# Patient Record
Sex: Female | Born: 1960
Health system: Southern US, Community
[De-identification: ages and names within clinical notes are randomized; demographics above are authoritative.]

## PROBLEM LIST (undated history)

## (undated) DIAGNOSIS — T8859XA Other complications of anesthesia, initial encounter: Secondary | ICD-10-CM

## (undated) DIAGNOSIS — Z9889 Other specified postprocedural states: Secondary | ICD-10-CM

## (undated) DIAGNOSIS — E785 Hyperlipidemia, unspecified: Secondary | ICD-10-CM

## (undated) DIAGNOSIS — I10 Essential (primary) hypertension: Secondary | ICD-10-CM

## (undated) DIAGNOSIS — R112 Nausea with vomiting, unspecified: Secondary | ICD-10-CM

## (undated) DIAGNOSIS — T4145XA Adverse effect of unspecified anesthetic, initial encounter: Secondary | ICD-10-CM

## (undated) DIAGNOSIS — Z789 Other specified health status: Secondary | ICD-10-CM

## (undated) DIAGNOSIS — H409 Unspecified glaucoma: Secondary | ICD-10-CM

## (undated) HISTORY — PX: WISDOM TOOTH EXTRACTION: SHX21

## (undated) HISTORY — PX: TONSILLECTOMY: SUR1361

## (undated) HISTORY — DX: Hyperlipidemia, unspecified: E78.5

## (undated) HISTORY — DX: Unspecified glaucoma: H40.9

## (undated) HISTORY — PX: DILATION AND CURETTAGE OF UTERUS: SHX78

---

## 1998-02-05 ENCOUNTER — Ambulatory Visit (HOSPITAL_COMMUNITY): Admission: RE | Admit: 1998-02-05 | Discharge: 1998-02-05 | Payer: Self-pay | Admitting: Obstetrics & Gynecology

## 1998-04-29 ENCOUNTER — Other Ambulatory Visit: Admission: RE | Admit: 1998-04-29 | Discharge: 1998-04-29 | Payer: Self-pay | Admitting: Obstetrics & Gynecology

## 1999-06-09 ENCOUNTER — Other Ambulatory Visit: Admission: RE | Admit: 1999-06-09 | Discharge: 1999-06-09 | Payer: Self-pay | Admitting: Obstetrics & Gynecology

## 2001-03-10 ENCOUNTER — Inpatient Hospital Stay (HOSPITAL_COMMUNITY): Admission: AD | Admit: 2001-03-10 | Discharge: 2001-03-12 | Payer: Self-pay | Admitting: Obstetrics & Gynecology

## 2002-06-28 ENCOUNTER — Other Ambulatory Visit: Admission: RE | Admit: 2002-06-28 | Discharge: 2002-06-28 | Payer: Self-pay | Admitting: Surgery

## 2002-07-05 ENCOUNTER — Other Ambulatory Visit: Admission: RE | Admit: 2002-07-05 | Discharge: 2002-07-05 | Payer: Self-pay | Admitting: Internal Medicine

## 2002-08-18 ENCOUNTER — Encounter: Payer: Self-pay | Admitting: Internal Medicine

## 2002-08-18 ENCOUNTER — Ambulatory Visit (HOSPITAL_COMMUNITY): Admission: RE | Admit: 2002-08-18 | Discharge: 2002-08-18 | Payer: Self-pay | Admitting: Internal Medicine

## 2004-03-10 ENCOUNTER — Other Ambulatory Visit: Admission: RE | Admit: 2004-03-10 | Discharge: 2004-03-10 | Payer: Self-pay | Admitting: Internal Medicine

## 2005-06-28 ENCOUNTER — Encounter: Admission: RE | Admit: 2005-06-28 | Discharge: 2005-06-28 | Payer: Self-pay | Admitting: Internal Medicine

## 2008-12-08 ENCOUNTER — Emergency Department (HOSPITAL_BASED_OUTPATIENT_CLINIC_OR_DEPARTMENT_OTHER): Admission: EM | Admit: 2008-12-08 | Discharge: 2008-12-08 | Payer: Self-pay | Admitting: Emergency Medicine

## 2009-07-13 ENCOUNTER — Encounter: Admission: RE | Admit: 2009-07-13 | Discharge: 2009-07-13 | Payer: Self-pay | Admitting: Family Medicine

## 2010-01-24 ENCOUNTER — Encounter: Admission: RE | Admit: 2010-01-24 | Discharge: 2010-01-24 | Payer: Self-pay | Admitting: Obstetrics & Gynecology

## 2010-10-19 ENCOUNTER — Encounter: Payer: Self-pay | Admitting: Obstetrics & Gynecology

## 2013-08-01 ENCOUNTER — Other Ambulatory Visit: Payer: Self-pay | Admitting: Obstetrics & Gynecology

## 2013-08-01 DIAGNOSIS — N644 Mastodynia: Secondary | ICD-10-CM

## 2013-08-01 DIAGNOSIS — N643 Galactorrhea not associated with childbirth: Secondary | ICD-10-CM

## 2013-08-18 ENCOUNTER — Ambulatory Visit
Admission: RE | Admit: 2013-08-18 | Discharge: 2013-08-18 | Disposition: A | Discharge status: Home / Self Care | Payer: BC Managed Care – PPO | Source: Ambulatory Visit | Attending: Obstetrics & Gynecology | Admitting: Obstetrics & Gynecology

## 2013-08-18 DIAGNOSIS — N643 Galactorrhea not associated with childbirth: Secondary | ICD-10-CM

## 2013-08-18 DIAGNOSIS — N644 Mastodynia: Secondary | ICD-10-CM

## 2014-11-14 ENCOUNTER — Other Ambulatory Visit: Payer: Self-pay | Admitting: Gastroenterology

## 2014-11-22 ENCOUNTER — Encounter (HOSPITAL_COMMUNITY): Payer: Self-pay | Admitting: *Deleted

## 2014-11-26 ENCOUNTER — Other Ambulatory Visit: Payer: Self-pay | Admitting: Gastroenterology

## 2014-12-04 ENCOUNTER — Ambulatory Visit (HOSPITAL_COMMUNITY)
Admission: RE | Admit: 2014-12-04 | Discharge: 2014-12-04 | Disposition: A | Discharge status: Home / Self Care | Payer: BLUE CROSS/BLUE SHIELD | Source: Ambulatory Visit | Attending: Gastroenterology | Admitting: Gastroenterology

## 2014-12-04 ENCOUNTER — Ambulatory Visit (HOSPITAL_COMMUNITY): Payer: BLUE CROSS/BLUE SHIELD | Admitting: Anesthesiology

## 2014-12-04 ENCOUNTER — Encounter (HOSPITAL_COMMUNITY)
Admission: RE | Disposition: A | Discharge status: Home / Self Care | Payer: Self-pay | Source: Ambulatory Visit | Attending: Gastroenterology

## 2014-12-04 ENCOUNTER — Encounter (HOSPITAL_COMMUNITY): Payer: Self-pay | Admitting: *Deleted

## 2014-12-04 DIAGNOSIS — Z9089 Acquired absence of other organs: Secondary | ICD-10-CM | POA: Insufficient documentation

## 2014-12-04 DIAGNOSIS — Z1211 Encounter for screening for malignant neoplasm of colon: Secondary | ICD-10-CM | POA: Diagnosis not present

## 2014-12-04 HISTORY — DX: Other complications of anesthesia, initial encounter: T88.59XA

## 2014-12-04 HISTORY — DX: Adverse effect of unspecified anesthetic, initial encounter: T41.45XA

## 2014-12-04 HISTORY — PX: COLONOSCOPY WITH PROPOFOL: SHX5780

## 2014-12-04 HISTORY — DX: Other specified postprocedural states: Z98.890

## 2014-12-04 HISTORY — DX: Nausea with vomiting, unspecified: R11.2

## 2014-12-04 HISTORY — DX: Other specified health status: Z78.9

## 2014-12-04 SURGERY — COLONOSCOPY WITH PROPOFOL
Anesthesia: Monitor Anesthesia Care

## 2014-12-04 MED ORDER — LACTATED RINGERS IV SOLN
INTRAVENOUS | Status: DC
  Administered 2014-12-04: 1000 mL via INTRAVENOUS

## 2014-12-04 MED ORDER — PROPOFOL 10 MG/ML IV BOLUS
INTRAVENOUS | Status: AC
  Filled 2014-12-04: qty 20

## 2014-12-04 MED ORDER — PROPOFOL INFUSION 10 MG/ML OPTIME
INTRAVENOUS | Status: DC | PRN
  Administered 2014-12-04: 100 ug/kg/min via INTRAVENOUS

## 2014-12-04 MED ORDER — LIDOCAINE HCL (CARDIAC) 20 MG/ML IV SOLN
INTRAVENOUS | Status: DC | PRN
  Administered 2014-12-04: 50 mg via INTRAVENOUS

## 2014-12-04 MED ORDER — ONDANSETRON HCL 4 MG/2ML IJ SOLN
INTRAMUSCULAR | Status: DC | PRN
  Administered 2014-12-04: 4 mg via INTRAVENOUS

## 2014-12-04 MED ORDER — ONDANSETRON HCL 4 MG/2ML IJ SOLN
INTRAMUSCULAR | Status: AC
  Filled 2014-12-04: qty 2

## 2014-12-04 MED ORDER — LIDOCAINE HCL (CARDIAC) 20 MG/ML IV SOLN
INTRAVENOUS | Status: AC
  Filled 2014-12-04: qty 5

## 2014-12-04 MED ORDER — SODIUM CHLORIDE 0.9 % IV SOLN
INTRAVENOUS | Status: DC
Start: 2014-12-04 — End: 2014-12-04

## 2014-12-04 MED ORDER — PROPOFOL 10 MG/ML IV BOLUS
INTRAVENOUS | Status: DC | PRN
  Administered 2014-12-04 (×2): 50 mg via INTRAVENOUS

## 2014-12-04 MED ORDER — SODIUM CHLORIDE 0.9 % IV SOLN: INTRAVENOUS | Status: DC

## 2014-12-04 SURGICAL SUPPLY — 21 items

## 2014-12-04 NOTE — Anesthesia Preprocedure Evaluation (Signed)
Anesthesia Evaluation  Patient identified by MRN, date of birth, ID band Patient awake    Reviewed: Allergy & Precautions, NPO status , Patient's Chart, lab work & pertinent test results  History of Anesthesia Complications (+) PONV  Airway Mallampati: II  TM Distance: >3 FB Neck ROM: Full    Dental no notable dental hx.    Pulmonary neg pulmonary ROS,  breath sounds clear to auscultation  Pulmonary exam normal       Cardiovascular negative cardio ROS  Rhythm:Regular Rate:Normal     Neuro/Psych negative neurological ROS  negative psych ROS   GI/Hepatic negative GI ROS, Neg liver ROS,   Endo/Other  negative endocrine ROS  Renal/GU negative Renal ROS  negative genitourinary   Musculoskeletal negative musculoskeletal ROS (+)   Abdominal   Peds negative pediatric ROS (+)  Hematology negative hematology ROS (+)   Anesthesia Other Findings   Reproductive/Obstetrics negative OB ROS                             Anesthesia Physical Anesthesia Plan  ASA: I  Anesthesia Plan: MAC   Post-op Pain Management:    Induction:   Airway Management Planned: Simple Face Mask  Additional Equipment:   Intra-op Plan:   Post-operative Plan:   Informed Consent: I have reviewed the patients History and Physical, chart, labs and discussed the procedure including the risks, benefits and alternatives for the proposed anesthesia with the patient or authorized representative who has indicated his/her understanding and acceptance.   Dental advisory given  Plan Discussed with: CRNA  Anesthesia Plan Comments:         Anesthesia Quick Evaluation

## 2014-12-04 NOTE — Op Note (Signed)
Procedure: Baseline screening colonoscopy  Endoscopist: Earle Gell  Premedication: Propofol administered by anesthesia  Procedure: The patient was placed in the left lateral decubitus position. Anal inspection and digital rectal exam were normal. The Pentax pediatric colonoscope was introduced into the rectum and advanced to the cecum. A normal-appearing appendiceal orifice was identified. A normal-appearing ileocecal valve was identified. Colonic preparation for the exam today was good. Withdrawal time was 9 minutes  Rectum. Normal. I was unable to retroflex the colonoscope in the rectum.  Sigmoid colon and descending colon. Normal  Splenic flexure. Normal  Transverse colon. Normal  Hepatic flexure. Normal  Ascending colon. Normal  Cecum and ileocecal valve. Normal.  Assessment: Normal screening colonoscopy  Recommendation: Schedule repeat screening colonoscopy in 10 years

## 2014-12-04 NOTE — Anesthesia Postprocedure Evaluation (Signed)
  Anesthesia Post-op Note  Patient: Leslie Frederick  Procedure(s) Performed: Procedure(s) (LRB): COLONOSCOPY WITH PROPOFOL (N/A)  Patient Location: PACU  Anesthesia Type: MAC  Level of Consciousness: awake and alert   Airway and Oxygen Therapy: Patient Spontanous Breathing  Post-op Pain: mild  Post-op Assessment: Post-op Vital signs reviewed, Patient's Cardiovascular Status Stable, Respiratory Function Stable, Patent Airway and No signs of Nausea or vomiting  Last Vitals:  Filed Vitals:   12/04/14 1237  BP: 151/76  Pulse: 60  Temp:   Resp: 14    Post-op Vital Signs: stable   Complications: No apparent anesthesia complications

## 2014-12-04 NOTE — Transfer of Care (Signed)
Immediate Anesthesia Transfer of Care Note  Patient: Leslie Frederick  Procedure(s) Performed: Procedure(s): COLONOSCOPY WITH PROPOFOL (N/A)  Patient Location: PACU  Anesthesia Type:MAC  Level of Consciousness: awake, alert  and oriented  Airway & Oxygen Therapy: Patient Spontanous Breathing and Patient connected to face mask oxygen  Post-op Assessment: Report given to RN and Post -op Vital signs reviewed and stable  Post vital signs: Reviewed and stable  Last Vitals:  Filed Vitals:   12/04/14 1056  BP: 149/75  Temp: 36.3 C  Resp: 16    Complications: No apparent anesthesia complications

## 2014-12-04 NOTE — H&P (Signed)
  Procedure: Baseline screening colonoscopy  History: The patient is a 54 year old female born 05-13-1961. She is scheduled to undergo her first screening colonoscopy polypectomy to prevent colon cancer.  Medication allergies: Stadol causes nausea  Past medical history: Seasonal allergies. D&C. Tonsillectomy.  Exam: The patient is alert and lying comfortably on the endoscopy stretcher. Abdomen is soft and nontender to palpation. Lungs are clear to auscultation. Cardiac exam reveals a regular rhythm.  Plan: Proceed with baseline screening colonoscopy

## 2014-12-05 ENCOUNTER — Encounter (HOSPITAL_COMMUNITY): Payer: Self-pay | Admitting: Gastroenterology

## 2015-07-15 ENCOUNTER — Other Ambulatory Visit: Payer: Self-pay

## 2015-07-15 DIAGNOSIS — Z1231 Encounter for screening mammogram for malignant neoplasm of breast: Secondary | ICD-10-CM

## 2015-08-07 ENCOUNTER — Ambulatory Visit
Admission: RE | Admit: 2015-08-07 | Discharge: 2015-08-07 | Disposition: A | Discharge status: Home / Self Care | Payer: BLUE CROSS/BLUE SHIELD | Source: Ambulatory Visit

## 2015-08-07 DIAGNOSIS — Z1231 Encounter for screening mammogram for malignant neoplasm of breast: Secondary | ICD-10-CM

## 2016-03-09 ENCOUNTER — Emergency Department (HOSPITAL_COMMUNITY): Payer: 59

## 2016-03-09 ENCOUNTER — Encounter (HOSPITAL_COMMUNITY): Payer: Self-pay | Admitting: *Deleted

## 2016-03-09 ENCOUNTER — Emergency Department (HOSPITAL_COMMUNITY)
Admission: EM | Admit: 2016-03-09 | Discharge: 2016-03-09 | Disposition: A | Discharge status: Home / Self Care | Payer: 59 | Attending: Emergency Medicine | Admitting: Emergency Medicine

## 2016-03-09 DIAGNOSIS — Z7982 Long term (current) use of aspirin: Secondary | ICD-10-CM | POA: Insufficient documentation

## 2016-03-09 DIAGNOSIS — R102 Pelvic and perineal pain: Secondary | ICD-10-CM | POA: Insufficient documentation

## 2016-03-09 DIAGNOSIS — I10 Essential (primary) hypertension: Secondary | ICD-10-CM | POA: Insufficient documentation

## 2016-03-09 DIAGNOSIS — Z79899 Other long term (current) drug therapy: Secondary | ICD-10-CM | POA: Diagnosis not present

## 2016-03-09 DIAGNOSIS — R1031 Right lower quadrant pain: Secondary | ICD-10-CM

## 2016-03-09 DIAGNOSIS — R109 Unspecified abdominal pain: Secondary | ICD-10-CM

## 2016-03-09 HISTORY — DX: Essential (primary) hypertension: I10

## 2016-03-09 LAB — URINALYSIS, ROUTINE W REFLEX MICROSCOPIC
Bilirubin Urine: NEGATIVE
Glucose, UA: NEGATIVE mg/dL
Hgb urine dipstick: NEGATIVE
Ketones, ur: NEGATIVE mg/dL
Leukocytes, UA: NEGATIVE
Nitrite: NEGATIVE
Protein, ur: NEGATIVE mg/dL
Specific Gravity, Urine: 1.016 (ref 1.005–1.030)
pH: 8.5 — ABNORMAL HIGH (ref 5.0–8.0)

## 2016-03-09 LAB — CBC
HCT: 36.1 % (ref 36.0–46.0)
Hemoglobin: 12 g/dL (ref 12.0–15.0)
MCH: 29.2 pg (ref 26.0–34.0)
MCHC: 33.2 g/dL (ref 30.0–36.0)
MCV: 87.8 fL (ref 78.0–100.0)
PLATELETS: 179 10*3/uL (ref 150–400)
RBC: 4.11 MIL/uL (ref 3.87–5.11)
RDW: 13 % (ref 11.5–15.5)
WBC: 6.3 10*3/uL (ref 4.0–10.5)

## 2016-03-09 LAB — BASIC METABOLIC PANEL
Anion gap: 5 (ref 5–15)
BUN: 11 mg/dL (ref 6–20)
CALCIUM: 9.1 mg/dL (ref 8.9–10.3)
CO2: 28 mmol/L (ref 22–32)
CREATININE: 0.48 mg/dL (ref 0.44–1.00)
Chloride: 107 mmol/L (ref 101–111)
GFR calc Af Amer: >60 mL/min (ref >60)
GLUCOSE: 97 mg/dL (ref 65–99)
POTASSIUM: 3.9 mmol/L (ref 3.5–5.1)
SODIUM: 140 mmol/L (ref 135–145)

## 2016-03-09 MED ORDER — MORPHINE SULFATE (PF) 4 MG/ML IV SOLN
4.0000 mg | Freq: Once | INTRAVENOUS | Status: AC
  Administered 2016-03-09: 4 mg via INTRAVENOUS
  Filled 2016-03-09: qty 1

## 2016-03-09 MED ORDER — ONDANSETRON 4 MG PO TBDP
4.0000 mg | ORAL_TABLET | Freq: Once | ORAL | Status: AC
  Administered 2016-03-09: 4 mg via ORAL
  Filled 2016-03-09: qty 1

## 2016-03-09 NOTE — ED Notes (Signed)
Pt brought in by EMS, pt was out for a walk this am, started to have a severe R groin/pelvic pain which radiated to her R flank pain.  Pt received Fentanyl 30mcg IVP, toradol 30mg , and zofran 4mg  IVP en route.  Pt reports nausea as well.

## 2016-03-09 NOTE — ED Notes (Signed)
Pt transported to US

## 2016-03-09 NOTE — ED Provider Notes (Signed)
CSN: [id number]     Arrival date & time 03/09/16  1048 History   First MD Initiated Contact with Patient 03/09/16 1312     Chief Complaint  Patient presents with  . Flank Pain  . Pelvic Pain   (Consider location/radiation/quality/duration/timing/severity/associated sxs/prior Treatment) HPI 55 y.o. female presents to the Emergency Department today complaining of right flank pain with onset this morning around 1000 while she was walking. Notes sharp in onset. Rates 10/10. Notified EMS and given Fentanyl, Toradol, Zofran en route. Has nausea with no vomiting. Pain controlled currently 3-4/10. No hx stones. Last BM yesterday. No fevers. No CP/SOB. No headaches. No vaginal bleeding. No vaginal discharge. No urinary symptoms. No other symptoms noted.    Past Medical History  Diagnosis Date  . Complication of anesthesia     Difficult to wake up  . PONV (postoperative nausea and vomiting)   . Medical history non-contributory   . Hypertension     borderline   Past Surgical History  Procedure Laterality Date  . Tonsillectomy    . Wisdom tooth extraction    . Dilation and curettage of uterus      x2  . Colonoscopy with propofol N/A 12/04/2014    Procedure: COLONOSCOPY WITH PROPOFOL;  Surgeon: Garlan Fair, MD;  Location: [facility];  Service: Endoscopy;  Laterality: N/A;   No family history on file. Social History  Substance Use Topics  . Smoking status: Never Smoker   . Smokeless tobacco: None  . Alcohol Use: Yes     Comment: rarely   OB History    No data available     Review of Systems ROS reviewed and all are negative for acute change except as noted in the HPI.  Allergies  Stadol  Home Medications   Prior to Admission medications   Medication Sig Start Date End Date Taking? Authorizing Provider  Multiple Vitamin (MULTIVITAMIN WITH MINERALS) TABS tablet Take 1 tablet by mouth every morning.    Historical Provider, MD  naproxen sodium (ANAPROX) 220 MG tablet Take 220  mg by mouth daily as needed (pain).    Historical Provider, MD  Polysaccharide Iron Complex (FERREX 150 PO) Take 1 tablet by mouth 3 (three) times a week.    Historical Provider, MD  valACYclovir (VALTREX) 1000 MG tablet Take 2,000 mg by mouth every 12 (twelve) hours as needed (breakout).  08/15/14   Historical Provider, MD   BP 135/78 mmHg  Pulse 61  Temp(Src) 98.1 F (36.7 C) (Oral)  Resp 16  SpO2 94%  LMP 02/26/2016   Physical Exam  Constitutional: She is oriented to person, place, and time. She appears well-developed and well-nourished.  HENT:  Head: Normocephalic and atraumatic.  Eyes: EOM are normal. Pupils are equal, round, and reactive to light.  Neck: Normal range of motion. Neck supple. No tracheal deviation present.  Cardiovascular: Normal rate, regular rhythm, normal heart sounds and intact distal pulses.   No murmur heard. Pulmonary/Chest: Effort normal and breath sounds normal. No respiratory distress. She has no wheezes. She has no rales. She exhibits no tenderness.  Abdominal: Soft. Normal appearance and bowel sounds are normal. There is no rigidity, no rebound, no guarding, no tenderness at McBurney's point and negative Murphy's sign.  Abdomen soft  Musculoskeletal: Normal range of motion.  Neurological: She is alert and oriented to person, place, and time.  Skin: Skin is warm and dry.  Psychiatric: She has a normal mood and affect. Her behavior is normal. Thought content normal.  Nursing note and vitals reviewed.  ED Course  Procedures (including critical care time) Labs Review Labs Reviewed  URINALYSIS, ROUTINE W REFLEX MICROSCOPIC (NOT AT [facility]) - Abnormal; Notable for the following:    pH 8.5 (*)    All other components within normal limits  CBC  BASIC METABOLIC PANEL   Imaging Review US Transvaginal Non-ob  03/09/2016  CLINICAL DATA:  Acute RIGHT lower quadrant abdominal pain EXAM: TRANSABDOMINAL AND TRANSVAGINAL ULTRASOUND OF PELVIS TECHNIQUE: Both  transabdominal and transvaginal ultrasound examinations of the pelvis were performed. Transabdominal technique was performed for global imaging of the pelvis including uterus, ovaries, adnexal regions, and pelvic cul-de-sac. It was necessary to proceed with endovaginal exam following the transabdominal exam to visualize the ovaries. COMPARISON:  None FINDINGS: Uterus Measurements: 8.5 x 4.5 x 5.5 cm. Normal morphology without mass. Endometrium Thickness: 9 mm thick, normal. No endometrial fluid or focal abnormality Right ovary Measurements: 2.4 x 1.7 x 1.8 cm. Dominant follicle 16 mm diameter. Otherwise normal morphology without additional mass. Left ovary Measurements: 4.1 x 2.6 x 2.5 cm. Dominant follicle 2.4 cm diameter. Otherwise normal morphology without additional mass. Other findings No free pelvic fluid or adnexal masses otherwise seen. IMPRESSION: Normal exam. Electronically Signed   By: Lavonia Dana M.D.   On: 03/09/2016 15:22   US Pelvis Complete  03/09/2016  CLINICAL DATA:  Acute RIGHT lower quadrant abdominal pain EXAM: TRANSABDOMINAL AND TRANSVAGINAL ULTRASOUND OF PELVIS TECHNIQUE: Both transabdominal and transvaginal ultrasound examinations of the pelvis were performed. Transabdominal technique was performed for global imaging of the pelvis including uterus, ovaries, adnexal regions, and pelvic cul-de-sac. It was necessary to proceed with endovaginal exam following the transabdominal exam to visualize the ovaries. COMPARISON:  None FINDINGS: Uterus Measurements: 8.5 x 4.5 x 5.5 cm. Normal morphology without mass. Endometrium Thickness: 9 mm thick, normal. No endometrial fluid or focal abnormality Right ovary Measurements: 2.4 x 1.7 x 1.8 cm. Dominant follicle 16 mm diameter. Otherwise normal morphology without additional mass. Left ovary Measurements: 4.1 x 2.6 x 2.5 cm. Dominant follicle 2.4 cm diameter. Otherwise normal morphology without additional mass. Other findings No free pelvic fluid or  adnexal masses otherwise seen. IMPRESSION: Normal exam. Electronically Signed   By: Lavonia Dana M.D.   On: 03/09/2016 15:22   Ct Renal Stone Study  03/09/2016  CLINICAL DATA:  Severe right groin and pelvic pain which radiates to the right flank. Symptoms began this morning. EXAM: CT ABDOMEN AND PELVIS WITHOUT CONTRAST TECHNIQUE: Multidetector CT imaging of the abdomen and pelvis was performed following the standard protocol without IV contrast. COMPARISON:  None. FINDINGS: Lower chest:  Unremarkable. Hepatobiliary: Scattered small water density lesions are seen throughout the liver measuring up to about 9 mm. Although these cannot be definitively characterize there are likely scattered tiny cyst. There is no evidence for gallstones, gallbladder wall thickening, or pericholecystic fluid. No intrahepatic or extrahepatic biliary dilation. Pancreas: No focal mass lesion. No dilatation of the main duct. No intraparenchymal cyst. No peripancreatic edema. Spleen: No splenomegaly. No focal mass lesion. Adrenals/Urinary Tract: No adrenal nodule or mass. Kidneys have normal uninfused imaging features. Specifically, no evidence for renal stones. No secondary changes in either kidney. No ureteral stones and no secondary changes noted ureter. The urinary bladder appears normal for the degree of distention. Stomach/Bowel: Stomach is nondistended. No gastric wall thickening. No evidence of outlet obstruction. Duodenum is normally positioned as is the ligament of Treitz. No small bowel wall thickening. No small bowel dilatation. The terminal  ileum is normal. The appendix is normal. No gross colonic mass. No colonic wall thickening. No substantial diverticular change. Vascular/Lymphatic: There is abdominal aortic atherosclerosis without aneurysm. Scattered lymph nodes in the root of the small bowel mesentery are upper normal for size. No evidence for retroperitoneal lymphadenopathy. Note gastrohepatic or hepatoduodenal ligament  lymphadenopathy. No pelvic sidewall lymphadenopathy. Reproductive: The uterus has normal CT imaging appearance. There is no adnexal mass. Dominant follicle or small benign appearing cyst noted in the left ovary. Other: No intraperitoneal free fluid. Musculoskeletal: Bone windows reveal no worrisome lytic or sclerotic osseous lesions. IMPRESSION: 1. No CT findings to explain the patient's history of groin pain. Specifically, no evidence for urinary stones. 2. Tiny low-density lesions scattered throughout the liver too small to characterize likely represent cysts. Electronically Signed   By: Misty Stanley M.D.   On: 03/09/2016 16:53   I have personally reviewed and evaluated these images and lab results as part of my medical decision-making.   EKG Interpretation None      MDM  I have reviewed and evaluated the relevant laboratory values. I have reviewed and evaluated the relevant imaging studies.  I have reviewed the relevant previous healthcare records. I obtained HPI from historian. Patient discussed with supervising physician  ED Course:  Assessment: Pt is a 22yF who presents with right lower flank pain with sudden onset 1000. EMS gave medication which aided in pain. On exam, pt in NAD. Nontoxic/nonseptic appearing. VSS. Afebrile. Lungs CTA. Heart RRR. Abdomen nontender soft. UA unremarkable. CBC/BMP unremarkable. Suspicious for Torsion. Pelvic US unremarkable. CT Non Con showed no renal stones. No appendicitis. No surgical abdomen identified. Given anaglesia in ED. Pain improved and patient feeling better on reexamination. Plan is to DC home with follow up to PCP. Pt agreeable to discharge. At time of discharge, Patient is in no acute distress. Vital Signs are stable. Patient is able to ambulate. Patient able to tolerate PO.    Disposition/Plan:  DC Home Additional Verbal discharge instructions given and discussed with patient.  Pt Instructed to f/u with PCP in the next week for evaluation and  treatment of symptoms. Return precautions given Pt acknowledges and agrees with plan  Supervising Physician Leo Grosser, MD   Final diagnoses:  Abdominal pain  Right lower quadrant abdominal pain     Shary Decamp, PA-C 03/09/16 1821  Leo Grosser, MD 03/10/16 0201

## 2016-03-09 NOTE — Discharge Instructions (Signed)
Please read and follow all provided instructions.  Your diagnoses today include:  1. Right lower quadrant abdominal pain   2. Abdominal pain     Tests performed today include:  CT Scan Abdomen/Pelvis  Urine test to look for infection and pregnancy (in women)  Vital signs. See below for your results today.   Medications prescribed:   Take any prescribed medications only as directed.  Home care instructions:   Follow any educational materials contained in this packet.  Follow-up instructions: Please follow-up with your primary care provider in the next 2 days for further evaluation of your symptoms.    Return instructions:  SEEK IMMEDIATE MEDICAL ATTENTION IF:  The pain does not go away or becomes severe   A temperature above 101F develops   Repeated vomiting occurs (multiple episodes)   The pain becomes localized to portions of the abdomen. The right side could possibly be appendicitis. In an adult, the left lower portion of the abdomen could be colitis or diverticulitis.   Blood is being passed in stools or vomit (bright red or black tarry stools)   You develop chest pain, difficulty breathing, dizziness or fainting, or become confused, poorly responsive, or inconsolable (young children)  If you have any other emergent concerns regarding your health  Additional Information: Abdominal (belly) pain can be caused by many things. Your caregiver performed an examination and possibly ordered blood/urine tests and imaging (CT scan, x-rays, ultrasound). Many cases can be observed and treated at home after initial evaluation in the emergency department. Even though you are being discharged home, abdominal pain can be unpredictable. Therefore, you need a repeated exam if your pain does not resolve, returns, or worsens. Most patients with abdominal pain don't have to be admitted to the hospital or have surgery, but serious problems like appendicitis and gallbladder attacks can start  out as nonspecific pain. Many abdominal conditions cannot be diagnosed in one visit, so follow-up evaluations are very important.  Your vital signs today were: BP 142/83 mmHg   Pulse 74   Temp(Src) 98.2 F (36.8 C) (Oral)   Resp 18   SpO2 100%   LMP 02/26/2016 If your blood pressure (bp) was elevated above 135/85 this visit, please have this repeated by your doctor within one month. --------------

## 2016-03-09 NOTE — ED Notes (Signed)
Pt transported to CT ?

## 2016-07-08 ENCOUNTER — Other Ambulatory Visit: Payer: Self-pay | Admitting: Obstetrics & Gynecology

## 2016-07-08 DIAGNOSIS — Z1231 Encounter for screening mammogram for malignant neoplasm of breast: Secondary | ICD-10-CM

## 2016-08-10 ENCOUNTER — Ambulatory Visit
Admission: RE | Admit: 2016-08-10 | Discharge: 2016-08-10 | Disposition: A | Discharge status: Home / Self Care | Payer: 59 | Source: Ambulatory Visit | Attending: Obstetrics & Gynecology | Admitting: Obstetrics & Gynecology

## 2016-08-10 DIAGNOSIS — Z1231 Encounter for screening mammogram for malignant neoplasm of breast: Secondary | ICD-10-CM

## 2016-10-23 DIAGNOSIS — M545 Low back pain: Secondary | ICD-10-CM | POA: Diagnosis not present

## 2016-10-23 DIAGNOSIS — Z9889 Other specified postprocedural states: Secondary | ICD-10-CM | POA: Diagnosis not present

## 2016-10-23 DIAGNOSIS — R1031 Right lower quadrant pain: Secondary | ICD-10-CM | POA: Diagnosis not present

## 2016-10-23 DIAGNOSIS — I700 Atherosclerosis of aorta: Secondary | ICD-10-CM | POA: Diagnosis not present

## 2016-11-18 DIAGNOSIS — I10 Essential (primary) hypertension: Secondary | ICD-10-CM | POA: Diagnosis not present

## 2016-12-09 DIAGNOSIS — Z0000 Encounter for general adult medical examination without abnormal findings: Secondary | ICD-10-CM | POA: Diagnosis not present

## 2017-03-23 DIAGNOSIS — D225 Melanocytic nevi of trunk: Secondary | ICD-10-CM | POA: Diagnosis not present

## 2017-03-23 DIAGNOSIS — D2222 Melanocytic nevi of left ear and external auricular canal: Secondary | ICD-10-CM | POA: Diagnosis not present

## 2017-03-23 DIAGNOSIS — L821 Other seborrheic keratosis: Secondary | ICD-10-CM | POA: Diagnosis not present

## 2017-04-28 ENCOUNTER — Other Ambulatory Visit: Payer: Self-pay | Admitting: Internal Medicine

## 2017-04-28 DIAGNOSIS — N63 Unspecified lump in unspecified breast: Secondary | ICD-10-CM | POA: Diagnosis not present

## 2017-04-28 DIAGNOSIS — N926 Irregular menstruation, unspecified: Secondary | ICD-10-CM | POA: Diagnosis not present

## 2017-04-30 ENCOUNTER — Ambulatory Visit
Admission: RE | Admit: 2017-04-30 | Discharge: 2017-04-30 | Disposition: A | Discharge status: Home / Self Care | Payer: 59 | Source: Ambulatory Visit | Attending: Internal Medicine | Admitting: Internal Medicine

## 2017-04-30 ENCOUNTER — Other Ambulatory Visit: Payer: Self-pay | Admitting: Internal Medicine

## 2017-04-30 DIAGNOSIS — N63 Unspecified lump in unspecified breast: Secondary | ICD-10-CM

## 2017-04-30 DIAGNOSIS — R928 Other abnormal and inconclusive findings on diagnostic imaging of breast: Secondary | ICD-10-CM | POA: Diagnosis not present

## 2017-04-30 DIAGNOSIS — N6489 Other specified disorders of breast: Secondary | ICD-10-CM | POA: Diagnosis not present

## 2017-09-15 DIAGNOSIS — Z01419 Encounter for gynecological examination (general) (routine) without abnormal findings: Secondary | ICD-10-CM | POA: Diagnosis not present

## 2017-11-05 ENCOUNTER — Ambulatory Visit
Admission: RE | Admit: 2017-11-05 | Discharge: 2017-11-05 | Disposition: A | Discharge status: Home / Self Care | Payer: 59 | Source: Ambulatory Visit | Attending: Internal Medicine | Admitting: Internal Medicine

## 2017-11-05 DIAGNOSIS — N63 Unspecified lump in unspecified breast: Secondary | ICD-10-CM

## 2017-11-05 DIAGNOSIS — N6001 Solitary cyst of right breast: Secondary | ICD-10-CM | POA: Diagnosis not present

## 2017-11-11 ENCOUNTER — Other Ambulatory Visit: Payer: Self-pay | Admitting: Internal Medicine

## 2017-11-11 DIAGNOSIS — Z1231 Encounter for screening mammogram for malignant neoplasm of breast: Secondary | ICD-10-CM

## 2017-12-27 DIAGNOSIS — S92501A Displaced unspecified fracture of right lesser toe(s), initial encounter for closed fracture: Secondary | ICD-10-CM | POA: Diagnosis not present

## 2017-12-27 DIAGNOSIS — M79674 Pain in right toe(s): Secondary | ICD-10-CM | POA: Diagnosis not present

## 2018-03-22 DIAGNOSIS — Z1159 Encounter for screening for other viral diseases: Secondary | ICD-10-CM | POA: Diagnosis not present

## 2018-03-22 DIAGNOSIS — I10 Essential (primary) hypertension: Secondary | ICD-10-CM | POA: Diagnosis not present

## 2018-03-22 DIAGNOSIS — E559 Vitamin D deficiency, unspecified: Secondary | ICD-10-CM | POA: Diagnosis not present

## 2018-03-22 DIAGNOSIS — D649 Anemia, unspecified: Secondary | ICD-10-CM | POA: Diagnosis not present

## 2018-03-22 DIAGNOSIS — N926 Irregular menstruation, unspecified: Secondary | ICD-10-CM | POA: Diagnosis not present

## 2018-03-22 DIAGNOSIS — Z0000 Encounter for general adult medical examination without abnormal findings: Secondary | ICD-10-CM | POA: Diagnosis not present

## 2018-05-25 DIAGNOSIS — H40013 Open angle with borderline findings, low risk, bilateral: Secondary | ICD-10-CM | POA: Diagnosis not present

## 2018-08-23 DIAGNOSIS — D2222 Melanocytic nevi of left ear and external auricular canal: Secondary | ICD-10-CM | POA: Diagnosis not present

## 2018-08-31 DIAGNOSIS — J209 Acute bronchitis, unspecified: Secondary | ICD-10-CM | POA: Diagnosis not present

## 2018-09-06 ENCOUNTER — Encounter (HOSPITAL_COMMUNITY): Payer: Self-pay | Admitting: Emergency Medicine

## 2018-09-06 ENCOUNTER — Emergency Department (HOSPITAL_COMMUNITY)
Admission: EM | Admit: 2018-09-06 | Discharge: 2018-09-06 | Disposition: A | Discharge status: Home / Self Care | Payer: 59 | Attending: Emergency Medicine | Admitting: Emergency Medicine

## 2018-09-06 ENCOUNTER — Other Ambulatory Visit: Payer: Self-pay

## 2018-09-06 DIAGNOSIS — E663 Overweight: Secondary | ICD-10-CM | POA: Diagnosis not present

## 2018-09-06 DIAGNOSIS — R102 Pelvic and perineal pain: Secondary | ICD-10-CM | POA: Insufficient documentation

## 2018-09-06 DIAGNOSIS — R1084 Generalized abdominal pain: Secondary | ICD-10-CM | POA: Diagnosis not present

## 2018-09-06 DIAGNOSIS — M545 Low back pain: Secondary | ICD-10-CM | POA: Diagnosis not present

## 2018-09-06 DIAGNOSIS — Z5321 Procedure and treatment not carried out due to patient leaving prior to being seen by health care provider: Secondary | ICD-10-CM | POA: Insufficient documentation

## 2018-09-06 DIAGNOSIS — N83202 Unspecified ovarian cyst, left side: Secondary | ICD-10-CM | POA: Diagnosis not present

## 2018-09-06 LAB — POC URINE PREG, ED: Preg Test, Ur: NEGATIVE

## 2018-09-06 NOTE — ED Notes (Signed)
Pt states that she "is no longer in emergent pain, and doesn't want to wait anymore to be seen". Staff encouraged the patient to stay, pt still chose to leave.

## 2018-09-06 NOTE — ED Triage Notes (Signed)
Pt reports 6/10 pelvic pain that radiates to her lower back and down both legs that started about 30 minutes ago. This is the pt's 3rd episode this month. No abnormal vaginal bleeding or discharge. Pt on menstrual cycle now.

## 2018-09-15 DIAGNOSIS — I10 Essential (primary) hypertension: Secondary | ICD-10-CM | POA: Diagnosis not present

## 2018-09-15 DIAGNOSIS — N6001 Solitary cyst of right breast: Secondary | ICD-10-CM | POA: Diagnosis not present

## 2018-09-15 DIAGNOSIS — J0110 Acute frontal sinusitis, unspecified: Secondary | ICD-10-CM | POA: Diagnosis not present

## 2018-10-17 DIAGNOSIS — Z01419 Encounter for gynecological examination (general) (routine) without abnormal findings: Secondary | ICD-10-CM | POA: Diagnosis not present

## 2018-10-17 DIAGNOSIS — Z6825 Body mass index (BMI) 25.0-25.9, adult: Secondary | ICD-10-CM | POA: Diagnosis not present

## 2018-10-21 ENCOUNTER — Ambulatory Visit
Admission: RE | Admit: 2018-10-21 | Discharge: 2018-10-21 | Disposition: A | Discharge status: Home / Self Care | Payer: BLUE CROSS/BLUE SHIELD | Source: Ambulatory Visit | Attending: Internal Medicine | Admitting: Internal Medicine

## 2018-10-21 DIAGNOSIS — Z1231 Encounter for screening mammogram for malignant neoplasm of breast: Secondary | ICD-10-CM

## 2018-10-24 DIAGNOSIS — D485 Neoplasm of uncertain behavior of skin: Secondary | ICD-10-CM | POA: Diagnosis not present

## 2018-10-24 DIAGNOSIS — L905 Scar conditions and fibrosis of skin: Secondary | ICD-10-CM | POA: Diagnosis not present

## 2019-03-24 DIAGNOSIS — R252 Cramp and spasm: Secondary | ICD-10-CM | POA: Diagnosis not present

## 2019-03-24 DIAGNOSIS — N926 Irregular menstruation, unspecified: Secondary | ICD-10-CM | POA: Diagnosis not present

## 2019-03-24 DIAGNOSIS — I10 Essential (primary) hypertension: Secondary | ICD-10-CM | POA: Diagnosis not present

## 2019-03-24 DIAGNOSIS — N6001 Solitary cyst of right breast: Secondary | ICD-10-CM | POA: Diagnosis not present

## 2019-03-24 DIAGNOSIS — Z0000 Encounter for general adult medical examination without abnormal findings: Secondary | ICD-10-CM | POA: Diagnosis not present

## 2019-05-29 DIAGNOSIS — Z3202 Encounter for pregnancy test, result negative: Secondary | ICD-10-CM | POA: Diagnosis not present

## 2019-05-29 DIAGNOSIS — R1031 Right lower quadrant pain: Secondary | ICD-10-CM | POA: Diagnosis not present

## 2019-09-08 DIAGNOSIS — H401132 Primary open-angle glaucoma, bilateral, moderate stage: Secondary | ICD-10-CM | POA: Diagnosis not present

## 2019-09-08 DIAGNOSIS — H2513 Age-related nuclear cataract, bilateral: Secondary | ICD-10-CM | POA: Diagnosis not present

## 2019-09-12 DIAGNOSIS — D224 Melanocytic nevi of scalp and neck: Secondary | ICD-10-CM | POA: Diagnosis not present

## 2019-09-12 DIAGNOSIS — Q825 Congenital non-neoplastic nevus: Secondary | ICD-10-CM | POA: Diagnosis not present

## 2019-09-12 DIAGNOSIS — L821 Other seborrheic keratosis: Secondary | ICD-10-CM | POA: Diagnosis not present

## 2019-09-12 DIAGNOSIS — D2272 Melanocytic nevi of left lower limb, including hip: Secondary | ICD-10-CM | POA: Diagnosis not present

## 2019-09-15 DIAGNOSIS — I10 Essential (primary) hypertension: Secondary | ICD-10-CM | POA: Diagnosis not present

## 2019-12-19 IMAGING — US ULTRASOUND RIGHT BREAST LIMITED
1 series · 3 of 3 positions shown · non-contrast
Comparison: Previous exam(s).

ADDENDUM:
After further review, it is noted that the patient's last bilateral
mammograms were performed on 08/12/2016. Therefore, bilateral
screening mammograms at this time are recommended.
CLINICAL DATA: 56-year-old female for follow-up of lower right
breast mass..

EXAM:
ULTRASOUND OF THE RIGHT BREAST

[Series 1: ultrasound right breast limited · 0.06mm/px · 3 of 3 slices shown]
[im 1/3]
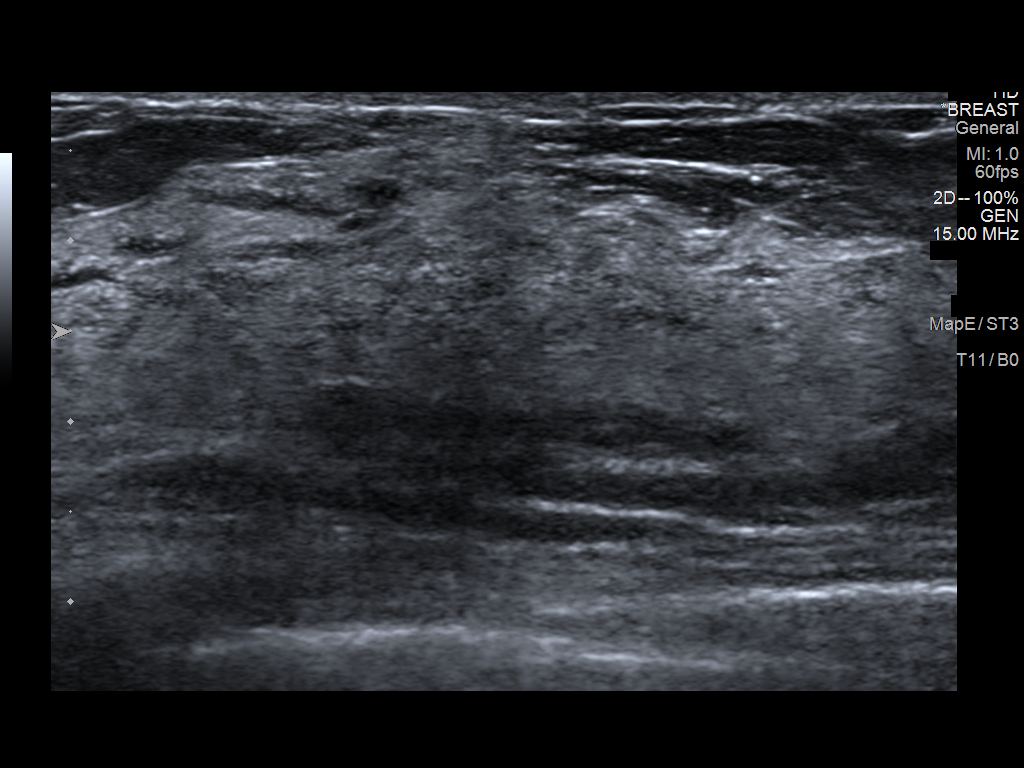
[im 2/3]
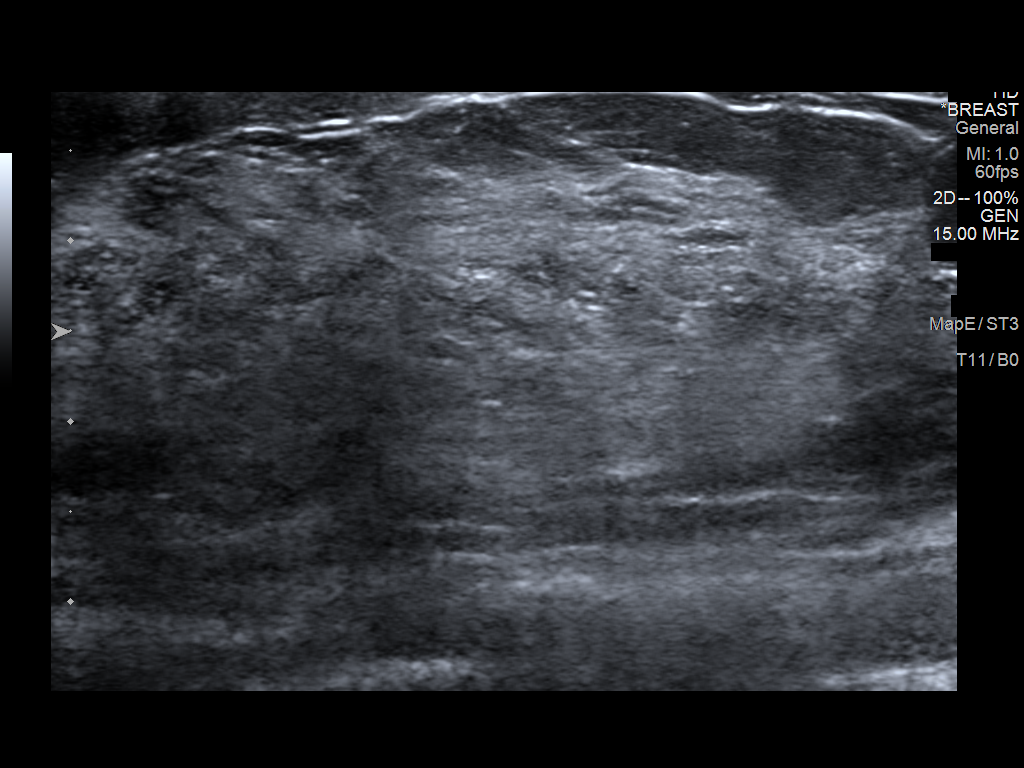
[im 3/3]
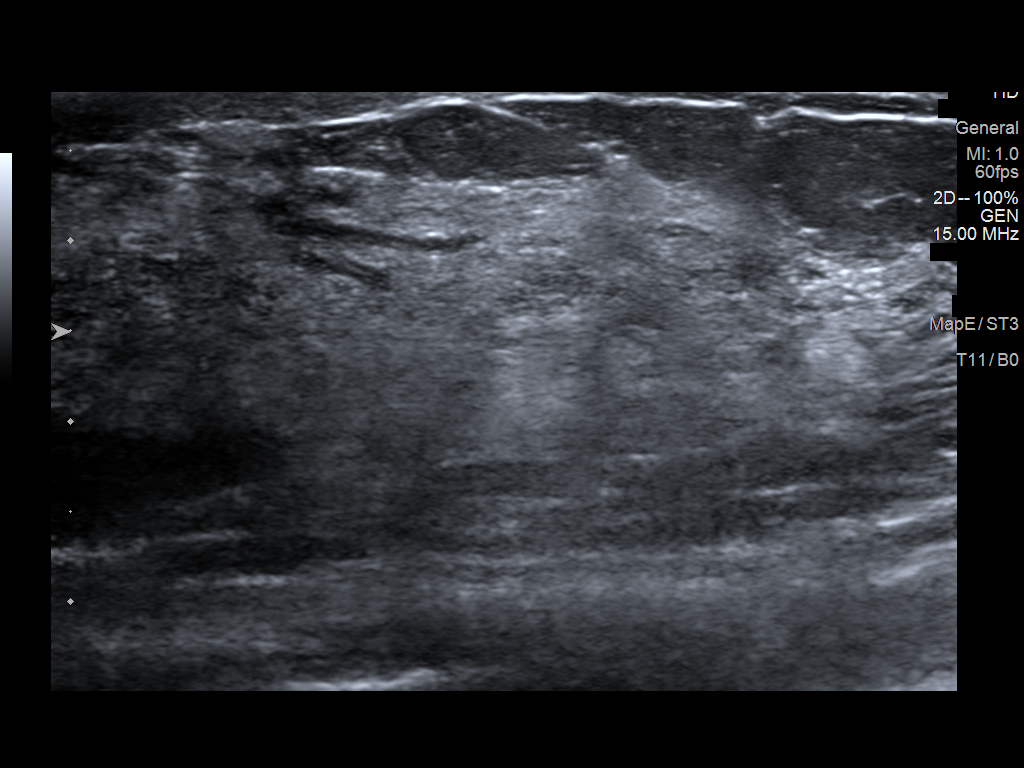

[3 of 3 positions shown; findings below may reference images not displayed]

FINDINGS: Targeted ultrasound is performed, showing interval resolution of the
hypoechoic mass at the 6 o'clock position of the right breast,
compatible with a resolved cyst. No solid or cystic mass, distortion
or abnormal shadowing within the lower or retroareolar right breast
identified..
IMPRESSION: Interval resolution of benign cyst within the lower right breast.

RECOMMENDATION:
Bilateral screening mammograms in 6 months to resume annual
mammogram schedule.

I have discussed the findings and recommendations with the patient.
Results were also provided in writing at the conclusion of the
visit. If applicable, a reminder letter will be sent to the patient
regarding the next appointment.

BI-RADS CATEGORY  1: Negative.

## 2020-05-03 ENCOUNTER — Other Ambulatory Visit: Payer: Self-pay | Admitting: Internal Medicine

## 2020-05-03 DIAGNOSIS — Z1231 Encounter for screening mammogram for malignant neoplasm of breast: Secondary | ICD-10-CM

## 2020-05-14 ENCOUNTER — Other Ambulatory Visit: Payer: Self-pay

## 2020-05-14 ENCOUNTER — Ambulatory Visit
Admission: RE | Admit: 2020-05-14 | Discharge: 2020-05-14 | Disposition: A | Discharge status: Home / Self Care | Payer: 59 | Source: Ambulatory Visit | Attending: Internal Medicine | Admitting: Internal Medicine

## 2020-05-14 DIAGNOSIS — Z1231 Encounter for screening mammogram for malignant neoplasm of breast: Secondary | ICD-10-CM

## 2021-04-23 ENCOUNTER — Other Ambulatory Visit: Payer: Self-pay | Admitting: Internal Medicine

## 2021-04-23 DIAGNOSIS — Z1231 Encounter for screening mammogram for malignant neoplasm of breast: Secondary | ICD-10-CM

## 2021-06-13 ENCOUNTER — Other Ambulatory Visit: Payer: Self-pay

## 2021-06-13 ENCOUNTER — Ambulatory Visit
Admission: RE | Admit: 2021-06-13 | Discharge: 2021-06-13 | Disposition: A | Discharge status: Home / Self Care | Payer: 59 | Source: Ambulatory Visit | Attending: Internal Medicine | Admitting: Internal Medicine

## 2021-06-13 DIAGNOSIS — Z1231 Encounter for screening mammogram for malignant neoplasm of breast: Secondary | ICD-10-CM

## 2022-01-27 ENCOUNTER — Encounter: Payer: Self-pay | Admitting: Neurology

## 2022-01-27 ENCOUNTER — Ambulatory Visit: Payer: 59 | Admitting: Neurology

## 2022-01-27 VITALS — BP 127/75 | HR 77 | Ht 67.75 in | Wt 176.5 lb

## 2022-01-27 DIAGNOSIS — G40909 Epilepsy, unspecified, not intractable, without status epilepticus: Secondary | ICD-10-CM | POA: Diagnosis not present

## 2022-01-27 MED ORDER — LEVETIRACETAM ER 750 MG PO TB24: 750.0000 mg | ORAL_TABLET | Freq: Every evening | ORAL | 11 refills | Status: DC

## 2022-01-27 NOTE — Progress Notes (Signed)
? ?Chief Complaint  ?Patient presents with  ? New Patient (Initial Visit)  ?  Room 14 w/ husband, Gwyndolyn Saxon. Reports total of three nocturnal seizures within the last year. PCP had prescribed gabapentin '300mg'$  daily. She wanted to complete neurological exam prior to starting. She has not had any testing.   ? ? ? ? ?ASSESSMENT AND PLAN ? ?Leslie Frederick is a 61 y.o. female   ?3 nocturnal generalized epilepsy ? Most recent 1 was on January 21, 2022 ? Complete evaluation with MRI of the brain with without contrast ? EEG ? Start Keppra XR 750 mg every night ? Call clinic for recurrent seizure ? No driving until seizure-free for 6 months ? Return to clinic in 6 months with nurse practitioner ? ?DIAGNOSTIC DATA (LABS, IMAGING, TESTING) ?- I reviewed patient records, labs, notes, testing and imaging myself where available. ? ?Laboratory evaluation January 23, 2022, normal CMP creatinine 0.53 with exception of elevated ALT 68, AST 60, normal CBC with hemoglobin of 12.4, ? ?MEDICAL HISTORY: ? 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 ?I reviewed and summarized the referring note. PMHX.  ? ?She denies a family history of seizure, had a history of minor head injury more than 20 years ago, fell down steps, with transient loss of consciousness, was checked at local hospital, no focal signs, she work as a Software engineer ? ?First seizure was in 2022, she could remember dreams associated with it, she was driving, gasping for air, husband witnessed seizure in her sleep, body was thrashing, lasting for few minutes ? ?Second seizure was in October 2022, no warning signs. ? 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 ?She denies focal signs, normal CBC with exception of mild elevated AST ALT.  Primary care is ordering more tests to evaluate abnormal liver function ? ? ?PHYSICAL EXAM: ?  ?Vitals:  ? 01/27/22 1036  ?BP: 127/75  ?Pulse: 77  ?Weight: 176 lb 8 oz (80.1 kg)  ?Height: 5' 7.75" (1.721 m)  ? ? ? ?Body mass index is 27.04 kg/m?. ? ?PHYSICAL EXAMNIATION: ? ?Gen: NAD, conversant, well nourised, well groomed                     ?Cardiovascular: Regular rate rhythm, no peripheral edema, warm, nontender. ?Eyes: Conjunctivae clear without exudates or hemorrhage ?Neck: Supple, no carotid bruits. ?Pulmonary: Clear to auscultation bilaterally  ? ?NEUROLOGICAL EXAM: ? ?MENTAL STATUS: ?Speech: ?   Speech is normal; fluent and spontaneous with normal comprehension.  ?Cognition: ?    Orientation to time, place and person ?    Normal recent and remote memory ?    Normal Attention span and concentration ?    Normal Language, naming, repeating,spontaneous speech ?    Fund of knowledge ?  ?CRANIAL NERVES: ?CN II: Visual fields are full to confrontation. Pupils are round equal and briskly reactive to light. ?CN III, IV, VI: extraocular movement are normal. No ptosis. ?CN V: Facial sensation is intact to light touch ?CN VII: Face is symmetric with normal eye closure  ?CN VIII: Hearing is normal  to causal conversation. ?CN IX, X: Phonation is normal. ?CN XI: Head turning and shoulder shrug are intact ? ?MOTOR: ?There is no pronator drift of out-stretched arms. Muscle bulk and tone are normal. Muscle strength is normal. ? ?REFLEXES: ?Reflexes are 2+ and symmetric at the biceps, triceps, knees, and ankles. Plantar responses are flexor. ? ?SENSORY: ?Intact to light touch, pinprick and vibratory sensation are intact in fingers and toes. ? ?COORDINATION: ?There is no trunk or limb dysmetria noted. ? ?GAIT/STANCE: ?Posture  is normal. Gait is steady with normal steps, base, arm swing, and turning. Heel and toe walking are normal. Tandem gait is normal.  ?Romberg is absent. ? ?REVIEW OF SYSTEMS:  ?Full 14 system review of systems performed and notable only for as above ?All other review of systems were negative. ? ? ?ALLERGIES: ?Allergies  ?Allergen Reactions  ? Stadol [Butorphanol] Other (See Comments)  ?  Made really hot and didn't go away for three hours  ? ? ?HOME MEDICATIONS: ?Current Outpatient Medications  ?Medication Sig Dispense Refill  ? bimatoprost (LUMIGAN) 0.01 % SOLN INSTILL 1 DROP IN BOTH EYES EVERY EVENING    ? Coenzyme Q10 (CO Q 10 PO) Take 1 tablet by mouth daily.    ? lisinopril (ZESTRIL) 10 MG tablet Take 10 mg by mouth daily.    ? naproxen sodium (ANAPROX) 220 MG tablet Take 220 mg by mouth daily as needed (pain).    ? Polysaccharide Iron Complex (FERREX 150 PO) Take 1 tablet by mouth once a week.     ? valACYclovir (VALTREX) 1000 MG tablet Take 2,000 mg by mouth every 12 (twelve) hours as needed (breakout).   0  ? vitamin C (ASCORBIC ACID) 500 MG tablet Take 500 mg by mouth once a week. With iron    ? VITAMIN D-VITAMIN K PO Take 1 tablet by mouth daily. Vit D: 5000 IU, Vit K: 145mg    ? aspirin EC 81 MG tablet Take 324 mg by mouth once.    ? ?No current facility-administered medications for this visit.  ? ? ?PAST MEDICAL HISTORY: ?Past Medical History:  ?Diagnosis Date  ? Complication of anesthesia   ? Difficult to wake up  ? Dyslipidemia   ? Glaucoma   ? Hypertension   ? borderline  ? PONV (postoperative nausea and vomiting)   ? ? ?PAST SURGICAL HISTORY: ?Past Surgical History:  ?Procedure Laterality Date  ? COLONOSCOPY WITH PROPOFOL N/A 12/04/2014  ? Procedure: COLONOSCOPY WITH PROPOFOL;  Surgeon: MGarlan Fair MD;  Location: [facility];  Service: Endoscopy;  Laterality: N/A;  ? DILATION AND CURETTAGE OF UTERUS    ? x2  ? TONSILLECTOMY    ? WISDOM TOOTH EXTRACTION    ? ? ?FAMILY HISTORY: ?Family History   ?Problem Relation Age of Onset  ? Emphysema Mother   ? COPD Mother   ? Hypertension Father   ? Congestive Heart Failure Father   ? Transient ischemic attack Father   ? Breast cancer Neg Hx   ? ? ?SOCIAL HISTORY: ?Social History  ? ?Socioeconomic History  ? Marital status: Married  ?  Spouse name: Not on file  ? Number of children: 2  ? Years of education: college  ? Highest education level: Doctorate  ?Occupational History  ? Occupation: Pharamacist  ?Tobacco Use  ? Smoking status: Never  ? Smokeless tobacco: Never  ?Vaping Use  ? Vaping Use: Never used  ?Substance and Sexual Activity  ? Alcohol use: Yes  ?  Comment:  rarely  ? Drug use: Never  ? Sexual activity: Not on file  ?Other Topics Concern  ? Not on file  ?Social History Narrative  ? Lives at home with her husband.  ? Right-handed.  ? Caffeine use: only 1/3 caffeine, 2 cups per day, occasionally 1 additional cup.  ? ?Social Determinants of Health  ? ?Financial Resource Strain: Not on file  ?Food Insecurity: Not on file  ?Transportation Needs: Not on file  ?Physical Activity: Not on file  ?Stress: Not on file  ?Social Connections: Not on file  ?Intimate Partner Violence: Not on file  ? ? ? ? ?Marcial Pacas, M.D. Ph.D. ? ?[facility] ?[address], [address] ?[place], [place] [address] ?Ph: [phone]) [phone] ?Fax: [phone] ? ?CC:  Leslie Cha, MD ?[address] ?[address] ?[place],  [place] [address]  Leslie Cha, MD   ?

## 2022-02-02 ENCOUNTER — Ambulatory Visit: Payer: 59 | Admitting: Neurology

## 2022-02-02 DIAGNOSIS — G40909 Epilepsy, unspecified, not intractable, without status epilepticus: Secondary | ICD-10-CM

## 2022-02-04 ENCOUNTER — Ambulatory Visit: Payer: 59

## 2022-02-04 DIAGNOSIS — G40909 Epilepsy, unspecified, not intractable, without status epilepticus: Secondary | ICD-10-CM | POA: Diagnosis not present

## 2022-02-04 MED ORDER — GADOBENATE DIMEGLUMINE 529 MG/ML IV SOLN
15.0000 mL | Freq: Once | INTRAVENOUS | Status: AC | PRN
  Administered 2022-02-04: 15 mL via INTRAVENOUS

## 2022-02-13 ENCOUNTER — Encounter: Payer: Self-pay | Admitting: Neurology

## 2022-02-14 ENCOUNTER — Encounter: Payer: Self-pay | Admitting: Neurology

## 2022-02-16 NOTE — Procedures (Signed)
   HISTORY: 61 year old female, presenting with 3 nocturnal seizure  TECHNIQUE:  This is a routine 16 channel EEG recording with one channel devoted to a limited EKG recording.  It was performed during wakefulness, drowsiness and asleep.  Hyperventilation and photic stimulation were performed as activating procedures.  There are minimum muscle and movement artifact noted.  Upon maximum arousal, posterior dominant waking rhythm consistent of rhythmic alpha range activity.  Activities are symmetric over the bilateral posterior derivations and attenuated with eye opening.  Hyperventilation produced mild/moderate buildup with higher amplitude and the slower activities noted.  Photic stimulation did not alter the tracing.  During EEG recording, patient developed drowsiness and no deeper stage of sleep was achieved  During EEG recording, there was no epileptiform discharge noted.  EKG demonstrate sinus rhythm, with heart rate of 68 bpm  CONCLUSION: This is a  normal awake EEG.  There is no electrodiagnostic evidence of epileptiform discharge.  Marcial Pacas, M.D. Ph.D.  [facility] [place], [place] [address] Phone: [phone] Fax:      [phone]

## 2022-02-17 ENCOUNTER — Encounter: Payer: Self-pay | Admitting: Neurology

## 2022-02-19 NOTE — Telephone Encounter (Signed)
EEG report faxed to Dr. Fara Olden [phone]

## 2022-07-14 ENCOUNTER — Other Ambulatory Visit: Payer: Self-pay | Admitting: Internal Medicine

## 2022-07-14 DIAGNOSIS — Z1231 Encounter for screening mammogram for malignant neoplasm of breast: Secondary | ICD-10-CM

## 2022-07-20 ENCOUNTER — Encounter: Payer: Self-pay | Admitting: Neurology

## 2022-08-03 ENCOUNTER — Encounter: Payer: Self-pay | Admitting: Neurology

## 2022-08-03 ENCOUNTER — Ambulatory Visit: Payer: Self-pay | Admitting: Neurology

## 2022-08-03 VITALS — BP 135/83 | HR 66 | Ht 67.75 in | Wt 159.0 lb

## 2022-08-03 DIAGNOSIS — G40909 Epilepsy, unspecified, not intractable, without status epilepticus: Secondary | ICD-10-CM

## 2022-08-03 MED ORDER — LEVETIRACETAM ER 750 MG PO TB24: 750.0000 mg | ORAL_TABLET | Freq: Every evening | ORAL | 3 refills | Status: DC

## 2022-08-03 NOTE — Progress Notes (Signed)
Chief Complaint  Patient presents with   Follow-up    Rm 15. Alone. Denies any new seizure activity. Planning to start CPAP next year. Reports feeling vibrations when leaning on objects.      ASSESSMENT AND PLAN  Leslie Frederick is a 61 y.o. female   3 nocturnal generalized epilepsy  Most recent 1 was on January 21, 2022   MRI of the brain with without contrast in May 2023 was normal  EEG in May 2023 was normal  Tolerated Keppra XR 750 mg every night  Call clinic for recurrent seizure   Return to clinic in 12 months with nurse practitioner Recent diagnosis of obstructive sleep apnea by [facility]  Will start CPAP soon  DIAGNOSTIC DATA (LABS, IMAGING, TESTING) - I reviewed patient records, labs, notes, testing and imaging myself where available.  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 PMHX.   She denies a family history of seizure, had a history of minor head injury more than 20 years ago, fell down steps, with transient loss of consciousness, was checked at local hospital, no focal signs, she work as a Engineer, production seizure was in 2022, she could remember dreams associated with it, she was driving, gasping for air, husband witnessed seizure in her sleep, body was thrashing, lasting for few minutes  Second seizure was in October 2022, no warning signs.  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  Primary care is ordering more tests to evaluate abnormal liver function  UPDATE Nov 6th 2023: She is doing well tolerating Keppra xr 750 mg every night, no recurrent seizure We personally reviewed MRI of the brain without contrast that was normal EEG was normal in May 2023  She had sleep study by [facility], was diagnosed with obstructive sleep apnea, does has narrow oropharyngeal space  PHYSICAL EXAM:   Vitals:   08/03/22 1005  BP: 135/83  Pulse: 66  Weight: 159 lb (72.1 kg)  Height: 5' 7.75" (1.721 m)   Body mass index is 24.35 kg/m.  PHYSICAL EXAMNIATION:  Gen: NAD, conversant, well nourised, well groomed                     Cardiovascular: Regular rate rhythm, no peripheral edema, warm, nontender. Eyes: Conjunctivae clear without exudates or hemorrhage Neck: Supple, no carotid bruits. Pulmonary: Clear to auscultation bilaterally   NEUROLOGICAL EXAM:  MENTAL STATUS: Speech/cognition: Awake, alert, oriented to history taking and casual conversation CRANIAL NERVES: CN II: Visual fields are full to confrontation. Pupils are round equal and briskly reactive to light. CN III, IV, VI: extraocular movement are normal. No ptosis. CN V: Facial sensation is intact to light touch CN VII: Face is symmetric with normal eye closure  CN VIII: Hearing is normal to causal conversation. CN  IX, X: Phonation is normal. CN XI: Head turning and shoulder shrug are intact CN XII: Narrow oropharyngeal space,  MOTOR: There is no pronator drift of out-stretched arms. Muscle bulk and tone are normal. Muscle strength is normal.  REFLEXES: Reflexes are 2+ and symmetric at the biceps, triceps, knees, and ankles. Plantar responses are flexor.  SENSORY: Intact to light touch, pinprick and vibratory sensation are intact in fingers and  toes.  COORDINATION: There is no trunk or limb dysmetria noted.  GAIT/STANCE: Posture is normal. Gait is steady     REVIEW OF SYSTEMS:  Full 14 system review of systems performed and notable only for as above All other review of systems were negative.   ALLERGIES: Allergies  Allergen Reactions   Stadol [Butorphanol] Other (See Comments)    Made really hot and didn't go away for three hours    HOME MEDICATIONS: Current Outpatient Medications  Medication Sig Dispense Refill   Calcium Citrate-Vitamin D (CITRACAL + D PO) Take by mouth.     Levetiracetam (KEPPRA XR) 750 MG TB24 Take 1 tablet (750 mg total) by mouth at bedtime. 30 tablet 11   lisinopril (ZESTRIL) 5 MG tablet Take 5 mg by mouth daily.     meloxicam (MOBIC) 7.5 MG tablet Take 7.5 mg by mouth daily.     Multiple Vitamin (MULTIVITAMIN) capsule Take 1 capsule by mouth daily.     valACYclovir (VALTREX) 1000 MG tablet Take 2,000 mg by mouth every 12 (twelve) hours as needed (breakout).   0   No current facility-administered medications for this visit.    PAST MEDICAL HISTORY: Past Medical History:  Diagnosis Date   Complication of anesthesia    Difficult to wake up   Dyslipidemia    Glaucoma    Hypertension    borderline   PONV (postoperative nausea and vomiting)     PAST SURGICAL HISTORY: Past Surgical History:  Procedure Laterality Date   COLONOSCOPY WITH PROPOFOL N/A 12/04/2014   Procedure: COLONOSCOPY WITH PROPOFOL;  Surgeon: Garlan Fair, MD;  Location: [facility];  Service: Endoscopy;  Laterality: N/A;   DILATION AND CURETTAGE OF UTERUS     x2   TONSILLECTOMY     WISDOM TOOTH EXTRACTION      FAMILY HISTORY: Family History  Problem Relation Age of Onset   Emphysema Mother    COPD Mother    Hypertension Father    Congestive Heart Failure Father    Transient ischemic attack Father    Breast cancer Neg Hx     SOCIAL HISTORY: Social History   Socioeconomic History   Marital status:  Married    Spouse name: Not on file   Number of children: 2   Years of education: college   Highest education level: Doctorate  Occupational History   Occupation: Environmental health practitioner  Tobacco Use   Smoking status: Never   Smokeless tobacco: Never  Vaping Use   Vaping Use: Never used  Substance and Sexual Activity   Alcohol use: Yes    Comment: rarely   Drug use: Never   Sexual activity: Not on file  Other Topics Concern   Not on file  Social History Narrative   Lives at home with her husband.   Right-handed.   Caffeine use: only 1/3 caffeine, 2 cups per day, occasionally 1 additional cup.   Social Determinants of Health   Financial Resource Strain: Not on file  Food Insecurity: Not on file  Transportation Needs: Not on file  Physical Activity: Not  on file  Stress: Not on file  Social Connections: Not on file  Intimate Partner Violence: Not on file      Marcial Pacas, M.D. Ph.D.  [facility] [address] [place], [place] [address] Ph: [phone] Fax: [phone]  CC:  Leslie Cha, MD [address] [place],   [address]  Leslie Cha, MD

## 2022-09-04 ENCOUNTER — Ambulatory Visit
Admission: RE | Admit: 2022-09-04 | Discharge: 2022-09-04 | Disposition: A | Discharge status: Home / Self Care | Payer: Managed Care, Other (non HMO) | Source: Ambulatory Visit | Attending: Internal Medicine | Admitting: Internal Medicine

## 2022-09-04 DIAGNOSIS — Z1231 Encounter for screening mammogram for malignant neoplasm of breast: Secondary | ICD-10-CM

## 2022-12-15 ENCOUNTER — Encounter: Payer: Self-pay | Admitting: Neurology

## 2023-01-05 ENCOUNTER — Encounter: Payer: Self-pay | Admitting: Neurology

## 2023-01-06 ENCOUNTER — Other Ambulatory Visit: Payer: Self-pay

## 2023-01-06 ENCOUNTER — Other Ambulatory Visit (HOSPITAL_COMMUNITY): Payer: Self-pay

## 2023-01-06 MED ORDER — LEVETIRACETAM ER 750 MG PO TB24: 750.0000 mg | ORAL_TABLET | Freq: Every evening | ORAL | 3 refills | Status: DC

## 2023-03-22 DIAGNOSIS — H04123 Dry eye syndrome of bilateral lacrimal glands: Secondary | ICD-10-CM | POA: Diagnosis not present

## 2023-03-22 DIAGNOSIS — H401134 Primary open-angle glaucoma, bilateral, indeterminate stage: Secondary | ICD-10-CM | POA: Diagnosis not present

## 2023-04-14 DIAGNOSIS — G4733 Obstructive sleep apnea (adult) (pediatric): Secondary | ICD-10-CM | POA: Diagnosis not present

## 2023-05-15 DIAGNOSIS — G4733 Obstructive sleep apnea (adult) (pediatric): Secondary | ICD-10-CM | POA: Diagnosis not present

## 2023-07-13 ENCOUNTER — Emergency Department (HOSPITAL_BASED_OUTPATIENT_CLINIC_OR_DEPARTMENT_OTHER)
Admission: EM | Admit: 2023-07-13 | Discharge: 2023-07-13 | Disposition: A | Discharge status: Home / Self Care | Payer: Managed Care, Other (non HMO) | Attending: Emergency Medicine | Admitting: Emergency Medicine

## 2023-07-13 ENCOUNTER — Emergency Department (HOSPITAL_BASED_OUTPATIENT_CLINIC_OR_DEPARTMENT_OTHER): Payer: Managed Care, Other (non HMO)

## 2023-07-13 ENCOUNTER — Other Ambulatory Visit: Payer: Self-pay

## 2023-07-13 ENCOUNTER — Encounter (HOSPITAL_BASED_OUTPATIENT_CLINIC_OR_DEPARTMENT_OTHER): Payer: Self-pay | Admitting: Emergency Medicine

## 2023-07-13 DIAGNOSIS — M79661 Pain in right lower leg: Secondary | ICD-10-CM | POA: Diagnosis present

## 2023-07-13 NOTE — Discharge Instructions (Signed)
Your ultrasound was unremarkable, given that you did not have any trauma, I think that this is likely secondary to muscle strain.  Use Tylenol, ibuprofen, and ice and heat for your discomfort.  Return to the ER if you do not have a pulse in your foot, your foot becomes cold, or red

## 2023-07-13 NOTE — ED Notes (Signed)
Ultrasound at bedside

## 2023-07-13 NOTE — ED Provider Notes (Signed)
[place] EMERGENCY DEPARTMENT AT [facility] Provider Note   CSN: [id number] Arrival date & time: 07/13/23  1555     History  Chief Complaint  Patient presents with   Leg Pain    Leslie Frederick is a 62 y.o. female, no pertinent past medical history, who presents to the ED secondary to right calf pain, that is been going on for the last day.  States she is standing and doing laundry, today, when she started having some throbbing in the back of her calf, and knee.  States that she does not have any kind of redness, swelling.  Is not any oral contraceptives.  Denies any recent surgery, or trauma.  Home Medications Prior to Admission medications   Medication Sig Start Date End Date Taking? Authorizing Provider  Calcium Citrate-Vitamin D (CITRACAL + D PO) Take by mouth.    [provider]  levETIRAcetam (KEPPRA XR) 750 MG 24 hr tablet Take 1 tablet (750 mg total) by mouth at bedtime. 01/06/23   Levert Feinstein, MD  lisinopril (ZESTRIL) 5 MG tablet Take 5 mg by mouth daily. 11/25/21   [provider]  meloxicam (MOBIC) 7.5 MG tablet Take 7.5 mg by mouth daily. 07/31/22   [provider]  Multiple Vitamin (MULTIVITAMIN) capsule Take 1 capsule by mouth daily.    [provider]  valACYclovir (VALTREX) 1000 MG tablet Take 2,000 mg by mouth every 12 (twelve) hours as needed (breakout).  08/15/14   [provider]      Allergies    Stadol [butorphanol]    Review of Systems   Review of Systems  Cardiovascular:  Negative for leg swelling.  Skin:  Negative for rash.    Physical Exam Updated Vital Signs BP (!) 156/82   Pulse 66   Temp 98 F (36.7 C)   Resp 14   Ht 5\' 8"  (1.727 m)   Wt 75.8 kg   SpO2 100%   BMI 25.39 kg/m  Physical Exam Vitals and nursing note reviewed.  Constitutional:      General: She is not in acute distress.    Appearance: She is well-developed.  HENT:     Head: Normocephalic and atraumatic.   Eyes:     General:        Right eye: No discharge.        Left eye: No discharge.     Conjunctiva/sclera: Conjunctivae normal.  Cardiovascular:     Comments: TTP along popliteal and calf. Negative Homan test.  Pulmonary:     Effort: No respiratory distress.  Skin:    General: Skin is warm and dry.     Comments: No rash, warmth  Neurological:     Mental Status: She is alert.     Comments: Clear speech.   Psychiatric:        Behavior: Behavior normal.        Thought Content: Thought content normal.     ED Results / Procedures / Treatments   Labs (all labs ordered are listed, but only abnormal results are displayed) Labs Reviewed - No data to display  EKG None  Radiology US Venous Img Lower Unilateral Right  Result Date: 07/13/2023 CLINICAL DATA:  r calf pain, cramping EXAM: RIGHT LOWER EXTREMITY VENOUS DOPPLER ULTRASOUND TECHNIQUE: Gray-scale sonography with compression, as well as color and duplex ultrasound, were performed to evaluate the deep venous system(s) from the level of the common femoral vein through the popliteal and proximal calf veins. COMPARISON:  US  Abdomen, 04/22/2022. FINDINGS: VENOUS Normal compressibility of the common femoral, superficial femoral, and popliteal veins, as well as the visualized calf veins. Visualized portions of profunda femoral vein and great saphenous vein unremarkable. No filling defects to suggest DVT on grayscale or color Doppler imaging. Doppler waveforms show normal direction of venous flow, normal respiratory plasticity and response to augmentation. Limited views of the contralateral common femoral vein are unremarkable. OTHER No evidence of superficial thrombophlebitis or abnormal fluid collection. Limitations: none IMPRESSION: No evidence of femoropopliteal DVT or superficial thrombophlebitis within the RIGHT lower extremity. Electronically Signed   By: Roanna Banning M.D.   On: 07/13/2023 19:22    Procedures Procedures     Medications Ordered in ED Medications - No data to display  ED Course/ Medical Decision Making/ A&P                                 Medical Decision Making Patient is a 62 year old female, here for right calf pain, leg pain, this been going on for the past day.  She states it happened when she was getting close out of the laundry today.  She does have some tenderness to palpation of the popliteal area, as well as the calf.  We obtain ultrasound to rule out a DVT, per her request.  She does not have any evidence of kind of erythema, ecchymosis, or swelling on my exam.  Amount and/or Complexity of Data Reviewed Radiology:     Details: Ultrasound unremarkable, no evidence of DVT. Discussion of management or test interpretation with external provider(s): DVT study unremarkable, no evidence of DVT on exam.  We discussed this likely represents a muscle strain, she has no erythema, warmth.  And occurred while she was getting close out of the laundry today.  We will have her follow-up with her primary care doctor, return if symptoms worsen.  She did have a dorsalis pedis pulse, no evidence of erythema, edema on exam    Final Clinical Impression(s) / ED Diagnoses Final diagnoses:  Right calf pain    Rx / DC Orders ED Discharge Orders     None         Dolphus Jenny, [place], PA 07/13/23 Barbette Reichmann, MD 07/13/23 2237

## 2023-07-13 NOTE — ED Triage Notes (Signed)
Right leg pain back of thigh, knee. Started today while getting clothes out of the laundry. Ambulatory.

## 2023-07-27 ENCOUNTER — Ambulatory Visit (HOSPITAL_COMMUNITY): Payer: Managed Care, Other (non HMO) | Attending: Cardiovascular Disease

## 2023-07-27 ENCOUNTER — Other Ambulatory Visit (HOSPITAL_COMMUNITY): Payer: Self-pay | Admitting: Internal Medicine

## 2023-07-27 DIAGNOSIS — R42 Dizziness and giddiness: Secondary | ICD-10-CM | POA: Diagnosis present

## 2023-07-27 DIAGNOSIS — I340 Nonrheumatic mitral (valve) insufficiency: Secondary | ICD-10-CM

## 2023-07-27 LAB — ECHOCARDIOGRAM COMPLETE
Area-P 1/2: 3.65 cm2
S' Lateral: 2.9 cm

## 2023-08-02 NOTE — Progress Notes (Unsigned)
No chief complaint on file.    ASSESSMENT AND PLAN  Leslie Frederick is a 62 y.o. female   3 nocturnal generalized epilepsy  Most recent 1 was on January 21, 2022   MRI of the brain with without contrast in May 2023 was normal  EEG in May 2023 was normal  Tolerated Keppra XR 750 mg every night  Call clinic for recurrent seizure   Return to clinic in 12 months with nurse practitioner Recent diagnosis of obstructive sleep apnea by [facility]  Will start CPAP soon       DIAGNOSTIC DATA (LABS, IMAGING, TESTING) - I reviewed patient records, labs, notes, testing and imaging myself where available.  Laboratory evaluation January 23, 2022, normal CMP creatinine 0.53 with exception of elevated ALT 68, AST 60, normal CBC with hemoglobin of 12.4,  MEDICAL HISTORY:  Update 08/02/2023 Leslie Frederick: returns for follow up visit. Stable since prior visit. No seizure activity, remains on Keppra XR 750mg  daily, denies side effects.        UPDATE Nov 6th 2023 Dr. Terrace Frederick: She is doing well tolerating Keppra xr 750 mg every night, no recurrent seizure We personally reviewed MRI of the brain without contrast that was normal EEG was normal in May 2023  She had sleep study by [facility], was diagnosed with obstructive sleep apnea, does has narrow oropharyngeal space  Consult visit 01/27/2022 Dr. 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 PMHX.   She denies a family history of seizure, had a history of minor head injury more than 20 years ago, fell down steps, with transient loss of consciousness, was checked at local hospital, no focal signs, she work as a Warden/ranger seizure was in 2022, she could remember dreams associated with it, she was driving, gasping  for air, husband witnessed seizure in her sleep, body was thrashing, lasting for few minutes  Second seizure was in October 2022, no warning signs.  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  Primary care is ordering more tests to evaluate abnormal liver function    PHYSICAL EXAM:   There were no vitals filed for this visit.  There is no height or weight on file to calculate BMI.  PHYSICAL EXAMNIATION:  Gen: NAD, conversant, well nourised, well groomed                     Cardiovascular: Regular rate rhythm, no peripheral edema, warm, nontender. Eyes: Conjunctivae clear without exudates or hemorrhage Neck: Supple, no carotid bruits. Pulmonary: Clear to auscultation bilaterally   NEUROLOGICAL EXAM:  MENTAL STATUS: Speech/cognition: Awake, alert, oriented to history taking and casual conversation CRANIAL NERVES: CN II: Visual fields are full to confrontation. Pupils are round equal and briskly reactive to light. CN III, IV, VI: extraocular movement are normal. No ptosis. CN V: Facial sensation is intact to light touch CN VII: Face is symmetric with normal eye closure  CN VIII: Hearing  is normal to causal conversation. CN IX, X: Phonation is normal. CN XI: Head turning and shoulder shrug are intact CN XII: Narrow oropharyngeal space,  MOTOR: There is no pronator drift of out-stretched arms. Muscle bulk and tone are normal. Muscle strength is normal.  REFLEXES: Reflexes are 2+ and symmetric at the biceps, triceps, knees, and ankles. Plantar responses are flexor.  SENSORY: Intact to light touch, pinprick and vibratory sensation are  intact in fingers and toes.  COORDINATION: There is no trunk or limb dysmetria noted.  GAIT/STANCE: Posture is normal. Gait is steady     REVIEW OF SYSTEMS:  Full 14 system review of systems performed and notable only for as above All other review of systems were negative.   ALLERGIES: Allergies  Allergen Reactions   Stadol [Butorphanol] Other (See Comments)    Made really hot and didn't go away for three hours    HOME MEDICATIONS: Current Outpatient Medications  Medication Sig Dispense Refill   Calcium Citrate-Vitamin D (CITRACAL + D PO) Take by mouth.     levETIRAcetam (KEPPRA XR) 750 MG 24 hr tablet Take 1 tablet (750 mg total) by mouth at bedtime. 90 tablet 3   lisinopril (ZESTRIL) 5 MG tablet Take 5 mg by mouth daily.     meloxicam (MOBIC) 7.5 MG tablet Take 7.5 mg by mouth daily.     Multiple Vitamin (MULTIVITAMIN) capsule Take 1 capsule by mouth daily.     valACYclovir (VALTREX) 1000 MG tablet Take 2,000 mg by mouth every 12 (twelve) hours as needed (breakout).   0   No current facility-administered medications for this visit.    PAST MEDICAL HISTORY: Past Medical History:  Diagnosis Date   Complication of anesthesia    Difficult to wake up   Dyslipidemia    Glaucoma    Hypertension    borderline   PONV (postoperative nausea and vomiting)     PAST SURGICAL HISTORY: Past Surgical History:  Procedure Laterality Date   COLONOSCOPY WITH PROPOFOL N/A 12/04/2014   Procedure: COLONOSCOPY WITH PROPOFOL;  Surgeon: Charolett Bumpers, MD;  Location: [facility];  Service: Endoscopy;  Laterality: N/A;   DILATION AND CURETTAGE OF UTERUS     x2   TONSILLECTOMY     WISDOM TOOTH EXTRACTION      FAMILY HISTORY: Family History  Problem Relation Age of Onset   Emphysema Mother    COPD Mother    Hypertension Father    Congestive Heart Failure Father    Transient ischemic attack Father    Breast cancer Neg Hx     SOCIAL HISTORY: Social History   Socioeconomic  History   Marital status: Married    Spouse name: Not on file   Number of children: 2   Years of education: college   Highest education level: Doctorate  Occupational History   Occupation: Diplomatic Services operational officer  Tobacco Use   Smoking status: Never   Smokeless tobacco: Never  Vaping Use   Vaping status: Never Used  Substance and Sexual Activity   Alcohol use: Yes    Comment: rarely   Drug use: Never   Sexual activity: Not on file  Other Topics Concern   Not on file  Social History Narrative   Lives at home with her husband.   Right-handed.   Caffeine use: only 1/3 caffeine, 2 cups per day, occasionally 1 additional cup.   Social Determinants of Health   Financial Resource Strain: Not on file  Food Insecurity: Not on file  Transportation  Needs: Not on file  Physical Activity: Not on file  Stress: Not on file  Social Connections: Not on file  Intimate Partner Violence: Not on file      I spent *** minutes of face-to-face and non-face-to-face time with patient.  This included previsit chart review, lab review, study review, order entry, electronic health record documentation, patient education and discussion regarding above diagnoses and treatment plan and answered all other questions to patient's satisfaction  Ihor Austin, [facility]  [facility] [address] [place], [place] [address]  Phone [phone] Fax [phone] Note: This document was prepared with digital dictation and possible smart phrase technology. Any transcriptional errors that result from this process are unintentional.

## 2023-08-04 ENCOUNTER — Ambulatory Visit: Payer: Managed Care, Other (non HMO) | Admitting: Adult Health

## 2023-08-04 ENCOUNTER — Encounter: Payer: Self-pay | Admitting: Adult Health

## 2023-08-04 VITALS — BP 126/78 | HR 64 | Ht 68.0 in | Wt 164.6 lb

## 2023-08-04 DIAGNOSIS — M2550 Pain in unspecified joint: Secondary | ICD-10-CM

## 2023-08-04 DIAGNOSIS — G40909 Epilepsy, unspecified, not intractable, without status epilepticus: Secondary | ICD-10-CM

## 2023-08-04 MED ORDER — LEVETIRACETAM ER 750 MG PO TB24: 750.0000 mg | ORAL_TABLET | Freq: Every evening | ORAL | 3 refills | Status: DC

## 2023-08-04 NOTE — Patient Instructions (Addendum)
Your Plan:  Continue Keppra XR 750 mg daily for seizure prevention  Please call with any seizure activity     Follow-up in 1 year or call earlier if needed      Thank you for coming to see [place] at [facility]. I hope we have been able to provide you high quality care today.  You may receive a patient satisfaction survey over the next few weeks. We would appreciate your feedback and comments so that we may continue to improve ourselves and the health of our patients.

## 2024-04-27 ENCOUNTER — Encounter: Payer: Self-pay | Admitting: Neurology

## 2024-05-22 ENCOUNTER — Encounter: Payer: Self-pay | Admitting: Adult Health

## 2024-05-23 ENCOUNTER — Telehealth: Payer: Self-pay

## 2024-05-23 NOTE — Telephone Encounter (Signed)
 Faxed ppw to [facility].

## 2024-07-05 ENCOUNTER — Other Ambulatory Visit

## 2024-07-05 ENCOUNTER — Ambulatory Visit: Admitting: Neurology

## 2024-07-05 ENCOUNTER — Encounter: Payer: Self-pay | Admitting: Neurology

## 2024-07-05 VITALS — BP 136/80 | HR 74 | Ht 68.0 in | Wt 161.0 lb

## 2024-07-05 DIAGNOSIS — R413 Other amnesia: Secondary | ICD-10-CM

## 2024-07-05 DIAGNOSIS — R569 Unspecified convulsions: Secondary | ICD-10-CM

## 2024-07-05 LAB — TSH: TSH: 1.1 m[IU]/L (ref 0.40–4.50)

## 2024-07-05 LAB — VITAMIN B12: Vitamin B-12: 588 pg/mL (ref 200–1100)

## 2024-07-05 MED ORDER — LEVETIRACETAM ER 750 MG PO TB24: 750.0000 mg | ORAL_TABLET | Freq: Every evening | ORAL | 3 refills | Status: AC

## 2024-07-05 NOTE — Progress Notes (Signed)
 NEUROLOGY CONSULTATION NOTE  Leslie Frederick MRN: [id number] DOB: 09/01/1961  Referring provider: Dr. Valery Ripple Primary care provider: Dr. Valery Ripple  Reason for consult:  seizure  Dear Dr Ripple:  Thank you for your kind referral of Leslie Frederick for consultation of the above symptoms. Although her history is well known to you, please allow me to reiterate it for the purpose of our medical record. The patient was accompanied to the clinic by her husband who also provides collateral information. Records and images were personally reviewed where available.   HISTORY OF PRESENT ILLNESS: This is a 63 year old right-handed woman with a history of hypertension, OSA, presenting to transfer care for seizures. She had been seeing neurologist Dr. Onita at [facility] since 01/2022, last visit in 07/2023. Per notes, she has had 3 nocturnal generalized seizures. Her husband thinks she has only had 2. The first seizure in 2022, her husband states the shaking was rather mild, she was difficult to wake up. When she woke up, she said she had a bad dream and was gasping for air. Her husband recalls another seizure within a month after with what looked like a grand mal seizure, lasting longer. She recalls that after the second one she hurt everywhere, she bit her lip. There was transient elevation in LFTs. Per Dr. Georgianne note, the last seizure was 01/21/22, her husband heard a high-pitched sound with tonic-clonic movements lasting 5 minutes. She had a brain MRI with and without contrast 01/2022 which was normal, EEG in 01/2022 normal. Her husband notes that the seizures started soon after she had her COVID booster. She was started on Keppra  XR 750mg  at bedtime. They note that Keppra  definitely changed her affect.   They deny any staring/unresponsive episodes. She denies any olfactory/gustatory hallucinations, deja vu, rising epigastric sensation, focal  numbness/tingling/weakness. She gets twitches every now and then. She feels a lot of vibrations when resting on the counter, it is an internal sensation that is not visible. She denies any headaches. She has dizziness every now and then when having pain with a warm nauseated feeling. She almost passed out at the beach in August. This was in the setting of drinking beer and taking Tramadol. She states she went out very briefly. She denies any diplopia, dysarthria/dysphagia, bladder dysfunction. She has periodic neck and back pain.  She has occasional constipation. She has myalgias and arthralgias, diagnosed by Rheum with osteoarthritis. She had right shoulder surgery on 9/8, chronic tendinitis affects sleep quality. She gets 6-8 hours of sleep. She stopped using her CPAP machine after COVID infection in December, she did not feel it helped. No daytime drowsiness. Mood is crummy. She thinks she is having memory issues. 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 They note more difficulties with personal things at home, she has to write things down. She used to be able to multitask doing 5 things, now it is overwhelming, she can only do 2 at a time. She gets angry when he tries to help. She denies missing medications. Most bills are on autopay. She lives with her husband, her 2 adult children have moved back in.   Epilepsy Risk Factors:  Her niece had seizures in childhood. She had a normal birth and early development.  There is no history of febrile convulsions, CNS infections such as meningitis/encephalitis, significant traumatic brain injury, neurosurgical procedures.    PAST MEDICAL HISTORY:  Past Medical History:  Diagnosis Date   Complication of anesthesia    Difficult to wake up   Dyslipidemia    Glaucoma    Hypertension    borderline   PONV (postoperative nausea and vomiting)     PAST SURGICAL HISTORY: Past Surgical  History:  Procedure Laterality Date   COLONOSCOPY WITH PROPOFOL  N/A 12/04/2014   Procedure: COLONOSCOPY WITH PROPOFOL ;  Surgeon: Gladis MARLA Louder, MD;  Location: [facility];  Service: Endoscopy;  Laterality: N/A;   DILATION AND CURETTAGE OF UTERUS     x2   TONSILLECTOMY     WISDOM TOOTH EXTRACTION      MEDICATIONS: Current Outpatient Medications on File Prior to Visit  Medication Sig Dispense Refill   Calcium Citrate-Vitamin D (CITRACAL + D PO) Take by mouth.     cyclobenzaprine (FLEXERIL) 10 MG tablet Take 10 mg by mouth 3 (three) times daily as needed for muscle spasms.     iron polysaccharides (NU-IRON) 150 MG capsule Take 150 mg by mouth daily.     levETIRAcetam  (KEPPRA  XR) 750 MG 24 hr tablet Take 1 tablet (750 mg total) by mouth at bedtime. 90 tablet 3   lisinopril (ZESTRIL) 5 MG tablet Take 5 mg by mouth daily.     valACYclovir (VALTREX) 1000 MG tablet Take 2,000 mg by mouth every 12 (twelve) hours as needed (breakout).   0   Zinc 50 MG TABS 1 tablet Orally Once a day for 30 day(s)     amitriptyline (ELAVIL) 25 MG tablet Take 12.5 mg by mouth at bedtime as needed for sleep. (Patient not taking: Reported on 07/05/2024)     bimatoprost (LUMIGAN) 0.01 % SOLN Place 1 drop into both eyes at bedtime. (Patient not taking: Reported on 07/05/2024)     Biotin 10 MG CAPS Take 10 mg by mouth daily. (Patient not taking: Reported on 07/05/2024)     Calcium 200 MG TABS Take 200 mg by mouth daily. (Patient not taking: Reported on 07/05/2024)     Cholecalciferol (VITAMIN D) 125 MCG (5000 UT) CAPS Take 125 mcg by mouth daily.     meloxicam (MOBIC) 7.5 MG tablet Take 7.5 mg by mouth daily.     Multiple Vitamin (MULTIVITAMIN) capsule Take 1 capsule by mouth daily.     No current facility-administered medications on file prior to visit.    ALLERGIES: Allergies  Allergen Reactions   Short Ragweed Pollen Ext Itching   Stadol [Butorphanol] Other (See Comments)    Made really hot and didn't go away for  three hours    FAMILY HISTORY: Family History  Problem Relation Age of Onset   Emphysema Mother    COPD Mother    Hypertension Father    Congestive Heart Failure Father    Transient ischemic attack Father    Breast cancer Neg Hx     SOCIAL HISTORY: Social History   Socioeconomic History   Marital status: Married    Spouse name: Not on file   Number of children: 2   Years of education: college   Highest education level: Doctorate  Occupational History   Occupation: Diplomatic Services Operational Officer  Tobacco Use   Smoking status: Never   Smokeless tobacco: Never  Vaping Use   Vaping status: Never Used  Substance and Sexual Activity   Alcohol use: Yes    Comment: rarely   Drug use: Never   Sexual activity: Not on file  Other Topics Concern   Not on file  Social History Narrative  Lives at home with her husband.   Right-handed.   Caffeine use: only 1/3 caffeine, 2 cups per day, occasionally 1 additional cup.   Social Drivers of Corporate Investment Banker Strain: Not on file  Food Insecurity: No Food Insecurity (08/31/2023)   Received from [facility]   Hunger Vital Sign    Within the past 12 months, you worried that your food would run out before you got the money to buy more.: Never true    Within the past 12 months, the food you bought just didn't last and you didn't have money to get more.: Never true  Transportation Needs: No Transportation Needs (08/31/2023)   Received from [facility] - Transportation    In the past 12 months, has lack of transportation kept you from medical appointments or from getting medications?: No    Lack of Transportation (Non-Medical): No  Physical Activity: Not on file  Stress: Not on file  Social Connections: Not on file  Intimate Partner Violence: Not on file     PHYSICAL EXAM: Vitals:   07/05/24 1028  BP: 136/80  Pulse: 74  SpO2: 98%   General: No acute distress Head:   Normocephalic/atraumatic Skin/Extremities: No rash, no edema Neurological Exam: Mental status: alert and oriented to person, place, and time, no dysarthria or aphasia, Fund of knowledge is appropriate.  Recent and remote memory are intact.  Attention and concentration are normal.    Able to name objects and repeat phrases.    07/05/2024   11:00 AM  MMSE - Mini Mental State Exam  Orientation to time 5  Orientation to Place 5  Registration 3  Attention/ Calculation 5  Recall 2  Language- name 2 objects 2  Language- repeat 1  Language- follow 3 step command 3  Language- read & follow direction 1  Write a sentence 1  Copy design 1  Total score 29    Cranial nerves: CN I: not tested CN II: pupils equal, round, visual fields intact CN III, IV, VI:  full range of motion, no nystagmus, no ptosis CN V: facial sensation intact CN VII: upper and lower face symmetric CN VIII: hearing intact to conversation Bulk & Tone: normal, no fasciculations. Motor: 5/5 throughout with no pronator drift, pain on right shoulder abduction Sensation: intact to light touch, cold, pin, vibration sense.  No extinction to double simultaneous stimulation.  Romberg test negative Deep Tendon Reflexes: +1 throughout Cerebellar: no incoordination on finger to nose testing Gait: narrow-based and steady,mild difficulty with tandem walk Tremor: none  IMPRESSION: This is a 63 year old right-handed woman with a history of hypertension, OSA, presenting to transfer care for seizures. All her seizures have been nocturnal, none since 2023 on Levetiracetam  ER 750mg  at bedtime. She is reporting mood changes with the Levetiracetam , and has also been having cognitive changes. Her neurological exam is non-focal, MMSE today 29/30. We discussed different causes of memory changes, check TSH and B12. MRI brain with and without contrast will be ordered to assess for underlying structural abnormality. We discussed medication management,  she would like to stay on the Levetiracetam  for now. [place] driving laws were discussed with the patient, and she knows to stop driving after a seizure, until 6 months seizure-free. Follow-up in 3 months, call for any changes.    Thank you for allowing me to participate in the care of this patient. Please do not hesitate to call for any questions or concerns.  Darice Shivers, M.D.  CC: Dr. Varadarajan

## 2024-07-05 NOTE — Patient Instructions (Signed)
 Good to meet you!  Have bloodwork done for TSH and B12  2. Schedule MRI brain with and without contrast  3. Continue Keppra  XR 750mg : take 1 tablet every night  4. Follow-up in 3 months, call for any changes   Seizure Precautions: 1. If medication has been prescribed for you to prevent seizures, take it exactly as directed.  Do not stop taking the medicine without talking to your doctor first, even if you have not had a seizure in a long time.   2. Avoid activities in which a seizure would cause danger to yourself or to others.  Don't operate dangerous machinery, swim alone, or climb in high or dangerous places, such as on ladders, roofs, or girders.  Do not drive unless your doctor says you may.  3. If you have any warning that you may have a seizure, lay down in a safe place where you can't hurt yourself.    4.  No driving for 6 months from last seizure, as per [place]  state law.   Please refer to the following link on the Epilepsy Foundation of America's website for more information: http://www.epilepsyfoundation.org/answerplace/Social/driving/drivingu.cfm   5.  Maintain good sleep hygiene. Avoid alcohol.  6.  Contact your doctor if you have any problems that may be related to the medicine you are taking.  7.  Call 911 and bring the patient back to the ED if:        A.  The seizure lasts longer than 5 minutes.       B.  The patient doesn't awaken shortly after the seizure  C.  The patient has new problems such as difficulty seeing, speaking or moving  D.  The patient was injured during the seizure  E.  The patient has a temperature over 102 F (39C)  F.  The patient vomited and now is having trouble breathing

## 2024-07-07 ENCOUNTER — Ambulatory Visit: Payer: Self-pay | Admitting: Neurology

## 2024-07-30 ENCOUNTER — Ambulatory Visit
Admission: RE | Admit: 2024-07-30 | Discharge: 2024-07-30 | Disposition: A | Source: Ambulatory Visit | Attending: Neurology

## 2024-07-30 DIAGNOSIS — R413 Other amnesia: Secondary | ICD-10-CM

## 2024-07-30 DIAGNOSIS — R569 Unspecified convulsions: Secondary | ICD-10-CM

## 2024-07-30 MED ORDER — GADOPICLENOL 0.5 MMOL/ML IV SOLN
7.0000 mL | Freq: Once | INTRAVENOUS | Status: AC | PRN
  Administered 2024-07-30: 7 mL via INTRAVENOUS

## 2024-08-03 ENCOUNTER — Telehealth: Payer: Managed Care, Other (non HMO) | Admitting: Adult Health

## 2024-08-03 NOTE — Telephone Encounter (Signed)
-----   Message from Darice CHRISTELLA Shivers sent at 08/03/2024  2:32 PM EST ----- Pls let her know brain MRI looks good, no tumor, stroke, or bleed. Thanks ----- Message ----- From: Interface, Rad Results In Sent: 08/03/2024   1:59 PM EST To: Darice CHRISTELLA Shivers, MD

## 2024-08-03 NOTE — Telephone Encounter (Signed)
Pt called an informed that brain MRI looks good, no tumor, stroke, or bleed

## 2024-08-08 ENCOUNTER — Encounter: Payer: Self-pay | Admitting: Neurology

## 2024-10-11 ENCOUNTER — Ambulatory Visit: Admitting: Neurology

## 2025-04-06 ENCOUNTER — Ambulatory Visit: Admitting: Neurology
